# Patient Record
Sex: Female | Born: 1960 | Race: Black or African American | Hispanic: No | Smoking: Never smoker
Health system: Southern US, Community
[De-identification: ages and names within clinical notes are randomized; demographics above are authoritative.]

---

## 2014-06-24 DIAGNOSIS — R51 Headache: Secondary | ICD-10-CM | POA: Diagnosis not present

## 2014-06-24 DIAGNOSIS — R5383 Other fatigue: Secondary | ICD-10-CM | POA: Diagnosis not present

## 2014-06-24 DIAGNOSIS — M7521 Bicipital tendinitis, right shoulder: Secondary | ICD-10-CM | POA: Diagnosis not present

## 2014-06-24 DIAGNOSIS — Z79899 Other long term (current) drug therapy: Secondary | ICD-10-CM | POA: Diagnosis not present

## 2014-06-24 DIAGNOSIS — M7522 Bicipital tendinitis, left shoulder: Secondary | ICD-10-CM | POA: Diagnosis not present

## 2014-06-24 DIAGNOSIS — M542 Cervicalgia: Secondary | ICD-10-CM | POA: Diagnosis not present

## 2014-07-09 DIAGNOSIS — M810 Age-related osteoporosis without current pathological fracture: Secondary | ICD-10-CM | POA: Diagnosis not present

## 2014-07-09 DIAGNOSIS — E785 Hyperlipidemia, unspecified: Secondary | ICD-10-CM | POA: Diagnosis not present

## 2014-07-09 DIAGNOSIS — G4700 Insomnia, unspecified: Secondary | ICD-10-CM | POA: Diagnosis not present

## 2014-07-09 DIAGNOSIS — Z6832 Body mass index (BMI) 32.0-32.9, adult: Secondary | ICD-10-CM | POA: Diagnosis not present

## 2014-07-09 DIAGNOSIS — F411 Generalized anxiety disorder: Secondary | ICD-10-CM | POA: Diagnosis not present

## 2014-07-09 DIAGNOSIS — M7521 Bicipital tendinitis, right shoulder: Secondary | ICD-10-CM | POA: Diagnosis not present

## 2014-07-09 DIAGNOSIS — I10 Essential (primary) hypertension: Secondary | ICD-10-CM | POA: Diagnosis not present

## 2014-07-09 DIAGNOSIS — K743 Primary biliary cirrhosis: Secondary | ICD-10-CM | POA: Diagnosis not present

## 2014-07-09 DIAGNOSIS — M7522 Bicipital tendinitis, left shoulder: Secondary | ICD-10-CM | POA: Diagnosis not present

## 2014-11-23 DIAGNOSIS — Z23 Encounter for immunization: Secondary | ICD-10-CM | POA: Diagnosis not present

## 2014-12-13 DIAGNOSIS — R0781 Pleurodynia: Secondary | ICD-10-CM | POA: Diagnosis not present

## 2014-12-13 DIAGNOSIS — I10 Essential (primary) hypertension: Secondary | ICD-10-CM | POA: Diagnosis not present

## 2014-12-13 DIAGNOSIS — K7460 Unspecified cirrhosis of liver: Secondary | ICD-10-CM | POA: Diagnosis not present

## 2014-12-13 DIAGNOSIS — R079 Chest pain, unspecified: Secondary | ICD-10-CM | POA: Diagnosis not present

## 2014-12-13 DIAGNOSIS — M810 Age-related osteoporosis without current pathological fracture: Secondary | ICD-10-CM | POA: Diagnosis not present

## 2014-12-13 DIAGNOSIS — Z87891 Personal history of nicotine dependence: Secondary | ICD-10-CM | POA: Diagnosis not present

## 2014-12-18 DIAGNOSIS — R0781 Pleurodynia: Secondary | ICD-10-CM | POA: Diagnosis not present

## 2014-12-18 DIAGNOSIS — Z79899 Other long term (current) drug therapy: Secondary | ICD-10-CM | POA: Diagnosis not present

## 2014-12-18 DIAGNOSIS — E785 Hyperlipidemia, unspecified: Secondary | ICD-10-CM | POA: Diagnosis not present

## 2014-12-18 DIAGNOSIS — I10 Essential (primary) hypertension: Secondary | ICD-10-CM | POA: Diagnosis not present

## 2014-12-18 DIAGNOSIS — F419 Anxiety disorder, unspecified: Secondary | ICD-10-CM | POA: Diagnosis not present

## 2014-12-18 DIAGNOSIS — Z87891 Personal history of nicotine dependence: Secondary | ICD-10-CM | POA: Diagnosis not present

## 2014-12-18 DIAGNOSIS — F329 Major depressive disorder, single episode, unspecified: Secondary | ICD-10-CM | POA: Diagnosis not present

## 2014-12-18 DIAGNOSIS — M810 Age-related osteoporosis without current pathological fracture: Secondary | ICD-10-CM | POA: Diagnosis not present

## 2014-12-18 DIAGNOSIS — S20211A Contusion of right front wall of thorax, initial encounter: Secondary | ICD-10-CM | POA: Diagnosis not present

## 2014-12-23 DIAGNOSIS — S20211A Contusion of right front wall of thorax, initial encounter: Secondary | ICD-10-CM | POA: Diagnosis not present

## 2014-12-23 DIAGNOSIS — Z6841 Body Mass Index (BMI) 40.0 and over, adult: Secondary | ICD-10-CM | POA: Diagnosis not present

## 2015-03-04 DIAGNOSIS — Z0189 Encounter for other specified special examinations: Secondary | ICD-10-CM | POA: Diagnosis not present

## 2015-03-04 DIAGNOSIS — R350 Frequency of micturition: Secondary | ICD-10-CM | POA: Diagnosis not present

## 2015-03-04 DIAGNOSIS — M810 Age-related osteoporosis without current pathological fracture: Secondary | ICD-10-CM | POA: Diagnosis not present

## 2015-03-04 DIAGNOSIS — Z0000 Encounter for general adult medical examination without abnormal findings: Secondary | ICD-10-CM | POA: Diagnosis not present

## 2015-03-04 DIAGNOSIS — K743 Primary biliary cirrhosis: Secondary | ICD-10-CM | POA: Diagnosis not present

## 2015-03-04 DIAGNOSIS — Z6841 Body Mass Index (BMI) 40.0 and over, adult: Secondary | ICD-10-CM | POA: Diagnosis not present

## 2015-04-14 DIAGNOSIS — K743 Primary biliary cirrhosis: Secondary | ICD-10-CM | POA: Diagnosis not present

## 2015-04-16 DIAGNOSIS — K743 Primary biliary cirrhosis: Secondary | ICD-10-CM | POA: Diagnosis not present

## 2015-09-28 DIAGNOSIS — M8589 Other specified disorders of bone density and structure, multiple sites: Secondary | ICD-10-CM | POA: Diagnosis not present

## 2015-09-28 DIAGNOSIS — Z780 Asymptomatic menopausal state: Secondary | ICD-10-CM | POA: Diagnosis not present

## 2015-09-28 DIAGNOSIS — Z1231 Encounter for screening mammogram for malignant neoplasm of breast: Secondary | ICD-10-CM | POA: Diagnosis not present

## 2015-10-15 DIAGNOSIS — N6002 Solitary cyst of left breast: Secondary | ICD-10-CM | POA: Diagnosis not present

## 2015-10-15 DIAGNOSIS — N63 Unspecified lump in breast: Secondary | ICD-10-CM | POA: Diagnosis not present

## 2015-10-15 DIAGNOSIS — R928 Other abnormal and inconclusive findings on diagnostic imaging of breast: Secondary | ICD-10-CM | POA: Diagnosis not present

## 2015-10-23 ENCOUNTER — Other Ambulatory Visit: Payer: Self-pay | Admitting: Gastroenterology

## 2015-10-23 DIAGNOSIS — M810 Age-related osteoporosis without current pathological fracture: Secondary | ICD-10-CM | POA: Diagnosis not present

## 2015-10-23 DIAGNOSIS — E669 Obesity, unspecified: Secondary | ICD-10-CM | POA: Diagnosis not present

## 2015-10-23 DIAGNOSIS — K745 Biliary cirrhosis, unspecified: Secondary | ICD-10-CM

## 2015-10-23 DIAGNOSIS — D696 Thrombocytopenia, unspecified: Secondary | ICD-10-CM | POA: Diagnosis not present

## 2015-10-23 DIAGNOSIS — K743 Primary biliary cirrhosis: Secondary | ICD-10-CM | POA: Diagnosis not present

## 2015-10-23 DIAGNOSIS — D638 Anemia in other chronic diseases classified elsewhere: Secondary | ICD-10-CM | POA: Diagnosis not present

## 2015-10-28 DIAGNOSIS — N63 Unspecified lump in breast: Secondary | ICD-10-CM | POA: Diagnosis not present

## 2015-10-28 DIAGNOSIS — N6011 Diffuse cystic mastopathy of right breast: Secondary | ICD-10-CM | POA: Diagnosis not present

## 2015-10-30 ENCOUNTER — Ambulatory Visit
Admission: RE | Admit: 2015-10-30 | Discharge: 2015-10-30 | Disposition: A | Discharge status: Home / Self Care | Payer: Medicare Other | Source: Ambulatory Visit | Attending: Gastroenterology | Admitting: Gastroenterology

## 2015-10-30 DIAGNOSIS — K7460 Unspecified cirrhosis of liver: Secondary | ICD-10-CM | POA: Diagnosis not present

## 2015-10-30 DIAGNOSIS — K745 Biliary cirrhosis, unspecified: Secondary | ICD-10-CM

## 2015-10-30 MED ORDER — IOPAMIDOL (ISOVUE-300) INJECTION 61%
100.0000 mL | Freq: Once | INTRAVENOUS | Status: AC | PRN
  Administered 2015-10-30: 100 mL via INTRAVENOUS

## 2015-11-02 DIAGNOSIS — H1131 Conjunctival hemorrhage, right eye: Secondary | ICD-10-CM | POA: Diagnosis not present

## 2015-11-10 DIAGNOSIS — K743 Primary biliary cirrhosis: Secondary | ICD-10-CM | POA: Diagnosis not present

## 2015-11-10 DIAGNOSIS — Z23 Encounter for immunization: Secondary | ICD-10-CM | POA: Diagnosis not present

## 2015-11-18 DIAGNOSIS — K529 Noninfective gastroenteritis and colitis, unspecified: Secondary | ICD-10-CM | POA: Diagnosis not present

## 2015-11-18 DIAGNOSIS — K629 Disease of anus and rectum, unspecified: Secondary | ICD-10-CM | POA: Diagnosis not present

## 2015-11-18 DIAGNOSIS — Z13810 Encounter for screening for upper gastrointestinal disorder: Secondary | ICD-10-CM | POA: Diagnosis not present

## 2015-11-18 DIAGNOSIS — K259 Gastric ulcer, unspecified as acute or chronic, without hemorrhage or perforation: Secondary | ICD-10-CM | POA: Diagnosis not present

## 2015-11-18 DIAGNOSIS — K269 Duodenal ulcer, unspecified as acute or chronic, without hemorrhage or perforation: Secondary | ICD-10-CM | POA: Diagnosis not present

## 2015-11-18 DIAGNOSIS — K449 Diaphragmatic hernia without obstruction or gangrene: Secondary | ICD-10-CM | POA: Diagnosis not present

## 2015-12-11 DIAGNOSIS — K743 Primary biliary cirrhosis: Secondary | ICD-10-CM | POA: Diagnosis not present

## 2015-12-28 DIAGNOSIS — K743 Primary biliary cirrhosis: Secondary | ICD-10-CM | POA: Diagnosis not present

## 2015-12-28 DIAGNOSIS — I10 Essential (primary) hypertension: Secondary | ICD-10-CM | POA: Diagnosis not present

## 2015-12-28 DIAGNOSIS — E669 Obesity, unspecified: Secondary | ICD-10-CM | POA: Diagnosis not present

## 2015-12-28 DIAGNOSIS — L98499 Non-pressure chronic ulcer of skin of other sites with unspecified severity: Secondary | ICD-10-CM | POA: Diagnosis not present

## 2015-12-28 DIAGNOSIS — Z23 Encounter for immunization: Secondary | ICD-10-CM | POA: Diagnosis not present

## 2015-12-28 DIAGNOSIS — Z6841 Body Mass Index (BMI) 40.0 and over, adult: Secondary | ICD-10-CM | POA: Diagnosis not present

## 2015-12-28 DIAGNOSIS — E7800 Pure hypercholesterolemia, unspecified: Secondary | ICD-10-CM | POA: Diagnosis not present

## 2015-12-28 DIAGNOSIS — M8580 Other specified disorders of bone density and structure, unspecified site: Secondary | ICD-10-CM | POA: Diagnosis not present

## 2016-01-11 DIAGNOSIS — D696 Thrombocytopenia, unspecified: Secondary | ICD-10-CM | POA: Diagnosis not present

## 2016-01-11 DIAGNOSIS — K259 Gastric ulcer, unspecified as acute or chronic, without hemorrhage or perforation: Secondary | ICD-10-CM | POA: Diagnosis not present

## 2016-01-11 DIAGNOSIS — M8580 Other specified disorders of bone density and structure, unspecified site: Secondary | ICD-10-CM | POA: Diagnosis not present

## 2016-01-11 DIAGNOSIS — K743 Primary biliary cirrhosis: Secondary | ICD-10-CM | POA: Diagnosis not present

## 2016-01-28 DIAGNOSIS — K289 Gastrojejunal ulcer, unspecified as acute or chronic, without hemorrhage or perforation: Secondary | ICD-10-CM | POA: Diagnosis not present

## 2016-01-28 DIAGNOSIS — K2930 Chronic superficial gastritis without bleeding: Secondary | ICD-10-CM | POA: Diagnosis not present

## 2016-01-28 DIAGNOSIS — K2970 Gastritis, unspecified, without bleeding: Secondary | ICD-10-CM | POA: Diagnosis not present

## 2016-02-03 DIAGNOSIS — K2930 Chronic superficial gastritis without bleeding: Secondary | ICD-10-CM | POA: Diagnosis not present

## 2016-02-11 DIAGNOSIS — R350 Frequency of micturition: Secondary | ICD-10-CM | POA: Diagnosis not present

## 2016-03-02 DIAGNOSIS — Z6841 Body Mass Index (BMI) 40.0 and over, adult: Secondary | ICD-10-CM | POA: Diagnosis not present

## 2016-03-02 DIAGNOSIS — E7800 Pure hypercholesterolemia, unspecified: Secondary | ICD-10-CM | POA: Diagnosis not present

## 2016-03-02 DIAGNOSIS — E669 Obesity, unspecified: Secondary | ICD-10-CM | POA: Diagnosis not present

## 2016-03-02 DIAGNOSIS — Z79899 Other long term (current) drug therapy: Secondary | ICD-10-CM | POA: Diagnosis not present

## 2016-05-03 DIAGNOSIS — D241 Benign neoplasm of right breast: Secondary | ICD-10-CM | POA: Diagnosis not present

## 2016-05-12 DIAGNOSIS — K743 Primary biliary cirrhosis: Secondary | ICD-10-CM | POA: Diagnosis not present

## 2016-06-06 ENCOUNTER — Other Ambulatory Visit: Payer: Self-pay | Admitting: Gastroenterology

## 2016-06-06 DIAGNOSIS — K219 Gastro-esophageal reflux disease without esophagitis: Secondary | ICD-10-CM | POA: Diagnosis not present

## 2016-06-06 DIAGNOSIS — L299 Pruritus, unspecified: Secondary | ICD-10-CM | POA: Diagnosis not present

## 2016-06-06 DIAGNOSIS — R634 Abnormal weight loss: Secondary | ICD-10-CM | POA: Diagnosis not present

## 2016-06-06 DIAGNOSIS — K625 Hemorrhage of anus and rectum: Secondary | ICD-10-CM | POA: Diagnosis not present

## 2016-06-06 DIAGNOSIS — K743 Primary biliary cirrhosis: Secondary | ICD-10-CM | POA: Diagnosis not present

## 2016-06-06 DIAGNOSIS — R1011 Right upper quadrant pain: Secondary | ICD-10-CM

## 2016-06-09 DIAGNOSIS — K5730 Diverticulosis of large intestine without perforation or abscess without bleeding: Secondary | ICD-10-CM | POA: Diagnosis not present

## 2016-06-09 DIAGNOSIS — K648 Other hemorrhoids: Secondary | ICD-10-CM | POA: Diagnosis not present

## 2016-06-09 DIAGNOSIS — K625 Hemorrhage of anus and rectum: Secondary | ICD-10-CM | POA: Diagnosis not present

## 2016-06-09 DIAGNOSIS — D126 Benign neoplasm of colon, unspecified: Secondary | ICD-10-CM | POA: Diagnosis not present

## 2016-06-09 DIAGNOSIS — K5520 Angiodysplasia of colon without hemorrhage: Secondary | ICD-10-CM | POA: Diagnosis not present

## 2016-06-09 DIAGNOSIS — K644 Residual hemorrhoidal skin tags: Secondary | ICD-10-CM | POA: Diagnosis not present

## 2016-06-10 ENCOUNTER — Ambulatory Visit
Admission: RE | Admit: 2016-06-10 | Discharge: 2016-06-10 | Disposition: A | Discharge status: Home / Self Care | Payer: Medicare Other | Source: Ambulatory Visit | Attending: Gastroenterology | Admitting: Gastroenterology

## 2016-06-10 DIAGNOSIS — R1011 Right upper quadrant pain: Secondary | ICD-10-CM

## 2016-06-14 DIAGNOSIS — D126 Benign neoplasm of colon, unspecified: Secondary | ICD-10-CM | POA: Diagnosis not present

## 2016-07-19 DIAGNOSIS — K743 Primary biliary cirrhosis: Secondary | ICD-10-CM | POA: Diagnosis not present

## 2016-07-21 DIAGNOSIS — F419 Anxiety disorder, unspecified: Secondary | ICD-10-CM | POA: Diagnosis not present

## 2016-07-21 DIAGNOSIS — F5104 Psychophysiologic insomnia: Secondary | ICD-10-CM | POA: Diagnosis not present

## 2016-10-12 DIAGNOSIS — K219 Gastro-esophageal reflux disease without esophagitis: Secondary | ICD-10-CM | POA: Diagnosis not present

## 2016-10-12 DIAGNOSIS — L03116 Cellulitis of left lower limb: Secondary | ICD-10-CM | POA: Diagnosis not present

## 2016-10-12 DIAGNOSIS — K743 Primary biliary cirrhosis: Secondary | ICD-10-CM | POA: Diagnosis not present

## 2016-10-12 DIAGNOSIS — S80861A Insect bite (nonvenomous), right lower leg, initial encounter: Secondary | ICD-10-CM | POA: Diagnosis not present

## 2016-10-25 DIAGNOSIS — F4310 Post-traumatic stress disorder, unspecified: Secondary | ICD-10-CM | POA: Diagnosis not present

## 2016-10-26 DIAGNOSIS — Z853 Personal history of malignant neoplasm of breast: Secondary | ICD-10-CM | POA: Diagnosis not present

## 2016-10-26 DIAGNOSIS — Z1231 Encounter for screening mammogram for malignant neoplasm of breast: Secondary | ICD-10-CM | POA: Diagnosis not present

## 2016-10-31 DIAGNOSIS — M8580 Other specified disorders of bone density and structure, unspecified site: Secondary | ICD-10-CM | POA: Diagnosis not present

## 2016-10-31 DIAGNOSIS — M859 Disorder of bone density and structure, unspecified: Secondary | ICD-10-CM | POA: Diagnosis not present

## 2016-10-31 DIAGNOSIS — Z6841 Body Mass Index (BMI) 40.0 and over, adult: Secondary | ICD-10-CM | POA: Diagnosis not present

## 2016-10-31 DIAGNOSIS — I10 Essential (primary) hypertension: Secondary | ICD-10-CM | POA: Diagnosis not present

## 2016-10-31 DIAGNOSIS — E7800 Pure hypercholesterolemia, unspecified: Secondary | ICD-10-CM | POA: Diagnosis not present

## 2016-10-31 DIAGNOSIS — F5104 Psychophysiologic insomnia: Secondary | ICD-10-CM | POA: Diagnosis not present

## 2016-10-31 DIAGNOSIS — F419 Anxiety disorder, unspecified: Secondary | ICD-10-CM | POA: Diagnosis not present

## 2016-12-20 ENCOUNTER — Other Ambulatory Visit: Payer: Self-pay | Admitting: Gastroenterology

## 2016-12-20 DIAGNOSIS — K743 Primary biliary cirrhosis: Secondary | ICD-10-CM

## 2016-12-29 ENCOUNTER — Ambulatory Visit
Admission: RE | Admit: 2016-12-29 | Discharge: 2016-12-29 | Disposition: A | Discharge status: Home / Self Care | Payer: Medicare Other | Source: Ambulatory Visit | Attending: Gastroenterology | Admitting: Gastroenterology

## 2016-12-29 DIAGNOSIS — K743 Primary biliary cirrhosis: Secondary | ICD-10-CM

## 2016-12-29 DIAGNOSIS — K7460 Unspecified cirrhosis of liver: Secondary | ICD-10-CM | POA: Diagnosis not present

## 2017-02-10 DIAGNOSIS — Z23 Encounter for immunization: Secondary | ICD-10-CM | POA: Diagnosis not present

## 2017-02-22 DIAGNOSIS — K210 Gastro-esophageal reflux disease with esophagitis: Secondary | ICD-10-CM | POA: Diagnosis not present

## 2017-02-22 DIAGNOSIS — K219 Gastro-esophageal reflux disease without esophagitis: Secondary | ICD-10-CM | POA: Diagnosis not present

## 2017-02-22 DIAGNOSIS — K2970 Gastritis, unspecified, without bleeding: Secondary | ICD-10-CM | POA: Diagnosis not present

## 2017-02-22 DIAGNOSIS — K257 Chronic gastric ulcer without hemorrhage or perforation: Secondary | ICD-10-CM | POA: Diagnosis not present

## 2017-02-22 DIAGNOSIS — K7460 Unspecified cirrhosis of liver: Secondary | ICD-10-CM | POA: Diagnosis not present

## 2017-04-18 DIAGNOSIS — K743 Primary biliary cirrhosis: Secondary | ICD-10-CM | POA: Diagnosis not present

## 2017-04-18 DIAGNOSIS — K219 Gastro-esophageal reflux disease without esophagitis: Secondary | ICD-10-CM | POA: Diagnosis not present

## 2017-04-18 DIAGNOSIS — K625 Hemorrhage of anus and rectum: Secondary | ICD-10-CM | POA: Diagnosis not present

## 2017-05-05 DIAGNOSIS — E7800 Pure hypercholesterolemia, unspecified: Secondary | ICD-10-CM | POA: Diagnosis not present

## 2017-05-05 DIAGNOSIS — M8580 Other specified disorders of bone density and structure, unspecified site: Secondary | ICD-10-CM | POA: Diagnosis not present

## 2017-05-05 DIAGNOSIS — I10 Essential (primary) hypertension: Secondary | ICD-10-CM | POA: Diagnosis not present

## 2017-05-05 DIAGNOSIS — Z23 Encounter for immunization: Secondary | ICD-10-CM | POA: Diagnosis not present

## 2017-05-05 DIAGNOSIS — F5104 Psychophysiologic insomnia: Secondary | ICD-10-CM | POA: Diagnosis not present

## 2017-05-05 DIAGNOSIS — F332 Major depressive disorder, recurrent severe without psychotic features: Secondary | ICD-10-CM | POA: Diagnosis not present

## 2017-06-13 DIAGNOSIS — F332 Major depressive disorder, recurrent severe without psychotic features: Secondary | ICD-10-CM | POA: Diagnosis not present

## 2017-08-22 DIAGNOSIS — F332 Major depressive disorder, recurrent severe without psychotic features: Secondary | ICD-10-CM | POA: Diagnosis not present

## 2017-08-22 DIAGNOSIS — Z6841 Body Mass Index (BMI) 40.0 and over, adult: Secondary | ICD-10-CM | POA: Diagnosis not present

## 2017-09-10 DIAGNOSIS — J069 Acute upper respiratory infection, unspecified: Secondary | ICD-10-CM | POA: Diagnosis not present

## 2017-09-10 DIAGNOSIS — I10 Essential (primary) hypertension: Secondary | ICD-10-CM | POA: Diagnosis not present

## 2017-09-10 DIAGNOSIS — H10023 Other mucopurulent conjunctivitis, bilateral: Secondary | ICD-10-CM | POA: Diagnosis not present

## 2017-10-28 DIAGNOSIS — Z0131 Encounter for examination of blood pressure with abnormal findings: Secondary | ICD-10-CM | POA: Diagnosis not present

## 2017-10-28 DIAGNOSIS — H10023 Other mucopurulent conjunctivitis, bilateral: Secondary | ICD-10-CM | POA: Diagnosis not present

## 2017-10-28 DIAGNOSIS — E782 Mixed hyperlipidemia: Secondary | ICD-10-CM | POA: Diagnosis not present

## 2017-12-22 DIAGNOSIS — F419 Anxiety disorder, unspecified: Secondary | ICD-10-CM | POA: Diagnosis not present

## 2017-12-22 DIAGNOSIS — Z6841 Body Mass Index (BMI) 40.0 and over, adult: Secondary | ICD-10-CM | POA: Diagnosis not present

## 2017-12-22 DIAGNOSIS — M8580 Other specified disorders of bone density and structure, unspecified site: Secondary | ICD-10-CM | POA: Diagnosis not present

## 2017-12-22 DIAGNOSIS — Z23 Encounter for immunization: Secondary | ICD-10-CM | POA: Diagnosis not present

## 2017-12-22 DIAGNOSIS — K743 Primary biliary cirrhosis: Secondary | ICD-10-CM | POA: Diagnosis not present

## 2017-12-22 DIAGNOSIS — K219 Gastro-esophageal reflux disease without esophagitis: Secondary | ICD-10-CM | POA: Diagnosis not present

## 2017-12-22 DIAGNOSIS — E7800 Pure hypercholesterolemia, unspecified: Secondary | ICD-10-CM | POA: Diagnosis not present

## 2017-12-22 DIAGNOSIS — F5104 Psychophysiologic insomnia: Secondary | ICD-10-CM | POA: Diagnosis not present

## 2017-12-22 DIAGNOSIS — I10 Essential (primary) hypertension: Secondary | ICD-10-CM | POA: Diagnosis not present

## 2018-01-01 DIAGNOSIS — S8012XA Contusion of left lower leg, initial encounter: Secondary | ICD-10-CM | POA: Diagnosis not present

## 2018-01-02 DIAGNOSIS — Z803 Family history of malignant neoplasm of breast: Secondary | ICD-10-CM | POA: Diagnosis not present

## 2018-01-02 DIAGNOSIS — M8589 Other specified disorders of bone density and structure, multiple sites: Secondary | ICD-10-CM | POA: Diagnosis not present

## 2018-01-02 DIAGNOSIS — Z1231 Encounter for screening mammogram for malignant neoplasm of breast: Secondary | ICD-10-CM | POA: Diagnosis not present

## 2018-01-08 DIAGNOSIS — M25572 Pain in left ankle and joints of left foot: Secondary | ICD-10-CM | POA: Diagnosis not present

## 2018-01-08 DIAGNOSIS — M62561 Muscle wasting and atrophy, not elsewhere classified, right lower leg: Secondary | ICD-10-CM | POA: Diagnosis not present

## 2018-01-08 DIAGNOSIS — M25571 Pain in right ankle and joints of right foot: Secondary | ICD-10-CM | POA: Diagnosis not present

## 2018-01-08 DIAGNOSIS — M62562 Muscle wasting and atrophy, not elsewhere classified, left lower leg: Secondary | ICD-10-CM | POA: Diagnosis not present

## 2018-01-09 DIAGNOSIS — M25571 Pain in right ankle and joints of right foot: Secondary | ICD-10-CM | POA: Diagnosis not present

## 2018-01-09 DIAGNOSIS — M62562 Muscle wasting and atrophy, not elsewhere classified, left lower leg: Secondary | ICD-10-CM | POA: Diagnosis not present

## 2018-01-09 DIAGNOSIS — M25572 Pain in left ankle and joints of left foot: Secondary | ICD-10-CM | POA: Diagnosis not present

## 2018-01-09 DIAGNOSIS — M62561 Muscle wasting and atrophy, not elsewhere classified, right lower leg: Secondary | ICD-10-CM | POA: Diagnosis not present

## 2018-01-11 DIAGNOSIS — M62561 Muscle wasting and atrophy, not elsewhere classified, right lower leg: Secondary | ICD-10-CM | POA: Diagnosis not present

## 2018-01-11 DIAGNOSIS — M62562 Muscle wasting and atrophy, not elsewhere classified, left lower leg: Secondary | ICD-10-CM | POA: Diagnosis not present

## 2018-01-11 DIAGNOSIS — M25572 Pain in left ankle and joints of left foot: Secondary | ICD-10-CM | POA: Diagnosis not present

## 2018-01-11 DIAGNOSIS — M25571 Pain in right ankle and joints of right foot: Secondary | ICD-10-CM | POA: Diagnosis not present

## 2018-01-15 DIAGNOSIS — M62561 Muscle wasting and atrophy, not elsewhere classified, right lower leg: Secondary | ICD-10-CM | POA: Diagnosis not present

## 2018-01-15 DIAGNOSIS — M62562 Muscle wasting and atrophy, not elsewhere classified, left lower leg: Secondary | ICD-10-CM | POA: Diagnosis not present

## 2018-01-15 DIAGNOSIS — M25571 Pain in right ankle and joints of right foot: Secondary | ICD-10-CM | POA: Diagnosis not present

## 2018-01-15 DIAGNOSIS — M25572 Pain in left ankle and joints of left foot: Secondary | ICD-10-CM | POA: Diagnosis not present

## 2018-01-26 DIAGNOSIS — M79669 Pain in unspecified lower leg: Secondary | ICD-10-CM | POA: Diagnosis not present

## 2018-03-20 DIAGNOSIS — K625 Hemorrhage of anus and rectum: Secondary | ICD-10-CM | POA: Diagnosis not present

## 2018-03-20 DIAGNOSIS — Z6841 Body Mass Index (BMI) 40.0 and over, adult: Secondary | ICD-10-CM | POA: Diagnosis not present

## 2018-03-20 DIAGNOSIS — K219 Gastro-esophageal reflux disease without esophagitis: Secondary | ICD-10-CM | POA: Diagnosis not present

## 2018-03-20 DIAGNOSIS — K743 Primary biliary cirrhosis: Secondary | ICD-10-CM | POA: Diagnosis not present

## 2018-03-20 DIAGNOSIS — E669 Obesity, unspecified: Secondary | ICD-10-CM | POA: Diagnosis not present

## 2018-03-22 ENCOUNTER — Other Ambulatory Visit: Payer: Self-pay | Admitting: Gastroenterology

## 2018-03-22 DIAGNOSIS — K743 Primary biliary cirrhosis: Secondary | ICD-10-CM

## 2018-03-26 ENCOUNTER — Ambulatory Visit
Admission: RE | Admit: 2018-03-26 | Discharge: 2018-03-26 | Disposition: A | Discharge status: Home / Self Care | Payer: Medicare Other | Source: Ambulatory Visit | Attending: Gastroenterology | Admitting: Gastroenterology

## 2018-03-26 DIAGNOSIS — K743 Primary biliary cirrhosis: Secondary | ICD-10-CM

## 2018-03-26 DIAGNOSIS — K7460 Unspecified cirrhosis of liver: Secondary | ICD-10-CM | POA: Diagnosis not present

## 2018-04-25 DIAGNOSIS — N644 Mastodynia: Secondary | ICD-10-CM | POA: Diagnosis not present

## 2018-04-27 DIAGNOSIS — N61 Mastitis without abscess: Secondary | ICD-10-CM | POA: Diagnosis not present

## 2018-06-27 DIAGNOSIS — F5104 Psychophysiologic insomnia: Secondary | ICD-10-CM | POA: Diagnosis not present

## 2018-06-27 DIAGNOSIS — E7800 Pure hypercholesterolemia, unspecified: Secondary | ICD-10-CM | POA: Diagnosis not present

## 2018-06-27 DIAGNOSIS — I10 Essential (primary) hypertension: Secondary | ICD-10-CM | POA: Diagnosis not present

## 2018-06-27 DIAGNOSIS — R69 Illness, unspecified: Secondary | ICD-10-CM | POA: Diagnosis not present

## 2018-06-27 DIAGNOSIS — L309 Dermatitis, unspecified: Secondary | ICD-10-CM | POA: Diagnosis not present

## 2018-06-27 DIAGNOSIS — E876 Hypokalemia: Secondary | ICD-10-CM | POA: Diagnosis not present

## 2018-06-29 DIAGNOSIS — I10 Essential (primary) hypertension: Secondary | ICD-10-CM | POA: Diagnosis not present

## 2018-08-03 DIAGNOSIS — L989 Disorder of the skin and subcutaneous tissue, unspecified: Secondary | ICD-10-CM | POA: Diagnosis not present

## 2018-08-09 DIAGNOSIS — M79672 Pain in left foot: Secondary | ICD-10-CM | POA: Diagnosis not present

## 2018-08-30 DIAGNOSIS — E7800 Pure hypercholesterolemia, unspecified: Secondary | ICD-10-CM | POA: Diagnosis not present

## 2018-08-30 DIAGNOSIS — D638 Anemia in other chronic diseases classified elsewhere: Secondary | ICD-10-CM | POA: Diagnosis not present

## 2018-08-30 DIAGNOSIS — R69 Illness, unspecified: Secondary | ICD-10-CM | POA: Diagnosis not present

## 2018-08-30 DIAGNOSIS — M19071 Primary osteoarthritis, right ankle and foot: Secondary | ICD-10-CM | POA: Diagnosis not present

## 2018-08-30 DIAGNOSIS — I10 Essential (primary) hypertension: Secondary | ICD-10-CM | POA: Diagnosis not present

## 2018-08-30 DIAGNOSIS — M1712 Unilateral primary osteoarthritis, left knee: Secondary | ICD-10-CM | POA: Diagnosis not present

## 2018-09-06 ENCOUNTER — Other Ambulatory Visit: Payer: Self-pay | Admitting: Gastroenterology

## 2018-09-06 DIAGNOSIS — K7469 Other cirrhosis of liver: Secondary | ICD-10-CM

## 2018-09-18 ENCOUNTER — Ambulatory Visit
Admission: RE | Admit: 2018-09-18 | Discharge: 2018-09-18 | Disposition: A | Discharge status: Home / Self Care | Payer: Medicare HMO | Source: Ambulatory Visit | Attending: Gastroenterology | Admitting: Gastroenterology

## 2018-09-18 DIAGNOSIS — K7469 Other cirrhosis of liver: Secondary | ICD-10-CM

## 2018-09-18 DIAGNOSIS — K7460 Unspecified cirrhosis of liver: Secondary | ICD-10-CM | POA: Diagnosis not present

## 2018-09-18 DIAGNOSIS — K743 Primary biliary cirrhosis: Secondary | ICD-10-CM | POA: Diagnosis not present

## 2018-09-18 DIAGNOSIS — K625 Hemorrhage of anus and rectum: Secondary | ICD-10-CM | POA: Diagnosis not present

## 2018-09-18 DIAGNOSIS — K219 Gastro-esophageal reflux disease without esophagitis: Secondary | ICD-10-CM | POA: Diagnosis not present

## 2018-09-20 DIAGNOSIS — K743 Primary biliary cirrhosis: Secondary | ICD-10-CM | POA: Diagnosis not present

## 2018-11-09 DIAGNOSIS — K625 Hemorrhage of anus and rectum: Secondary | ICD-10-CM | POA: Diagnosis not present

## 2018-11-09 DIAGNOSIS — K635 Polyp of colon: Secondary | ICD-10-CM | POA: Diagnosis not present

## 2018-11-09 DIAGNOSIS — K5520 Angiodysplasia of colon without hemorrhage: Secondary | ICD-10-CM | POA: Diagnosis not present

## 2018-11-09 DIAGNOSIS — K649 Unspecified hemorrhoids: Secondary | ICD-10-CM | POA: Diagnosis not present

## 2018-11-13 DIAGNOSIS — K635 Polyp of colon: Secondary | ICD-10-CM | POA: Diagnosis not present

## 2018-12-03 DIAGNOSIS — K625 Hemorrhage of anus and rectum: Secondary | ICD-10-CM | POA: Diagnosis not present

## 2018-12-03 DIAGNOSIS — K743 Primary biliary cirrhosis: Secondary | ICD-10-CM | POA: Diagnosis not present

## 2018-12-03 DIAGNOSIS — K219 Gastro-esophageal reflux disease without esophagitis: Secondary | ICD-10-CM | POA: Diagnosis not present

## 2018-12-03 DIAGNOSIS — I10 Essential (primary) hypertension: Secondary | ICD-10-CM | POA: Diagnosis not present

## 2018-12-03 DIAGNOSIS — E7800 Pure hypercholesterolemia, unspecified: Secondary | ICD-10-CM | POA: Diagnosis not present

## 2018-12-05 DIAGNOSIS — R69 Illness, unspecified: Secondary | ICD-10-CM | POA: Diagnosis not present

## 2018-12-05 DIAGNOSIS — R739 Hyperglycemia, unspecified: Secondary | ICD-10-CM | POA: Diagnosis not present

## 2018-12-05 DIAGNOSIS — E7800 Pure hypercholesterolemia, unspecified: Secondary | ICD-10-CM | POA: Diagnosis not present

## 2018-12-05 DIAGNOSIS — I10 Essential (primary) hypertension: Secondary | ICD-10-CM | POA: Diagnosis not present

## 2018-12-07 DIAGNOSIS — R69 Illness, unspecified: Secondary | ICD-10-CM | POA: Diagnosis not present

## 2018-12-07 DIAGNOSIS — I10 Essential (primary) hypertension: Secondary | ICD-10-CM | POA: Diagnosis not present

## 2018-12-07 DIAGNOSIS — R739 Hyperglycemia, unspecified: Secondary | ICD-10-CM | POA: Diagnosis not present

## 2018-12-07 DIAGNOSIS — E7800 Pure hypercholesterolemia, unspecified: Secondary | ICD-10-CM | POA: Diagnosis not present

## 2018-12-28 DIAGNOSIS — R69 Illness, unspecified: Secondary | ICD-10-CM | POA: Diagnosis not present

## 2019-01-01 DIAGNOSIS — M1712 Unilateral primary osteoarthritis, left knee: Secondary | ICD-10-CM | POA: Diagnosis not present

## 2019-01-01 DIAGNOSIS — M19071 Primary osteoarthritis, right ankle and foot: Secondary | ICD-10-CM | POA: Diagnosis not present

## 2019-01-01 DIAGNOSIS — I10 Essential (primary) hypertension: Secondary | ICD-10-CM | POA: Diagnosis not present

## 2019-01-01 DIAGNOSIS — M810 Age-related osteoporosis without current pathological fracture: Secondary | ICD-10-CM | POA: Diagnosis not present

## 2019-01-01 DIAGNOSIS — R69 Illness, unspecified: Secondary | ICD-10-CM | POA: Diagnosis not present

## 2019-01-01 DIAGNOSIS — E7800 Pure hypercholesterolemia, unspecified: Secondary | ICD-10-CM | POA: Diagnosis not present

## 2019-01-01 DIAGNOSIS — Z862 Personal history of diseases of the blood and blood-forming organs and certain disorders involving the immune mechanism: Secondary | ICD-10-CM | POA: Diagnosis not present

## 2019-01-25 DIAGNOSIS — Z803 Family history of malignant neoplasm of breast: Secondary | ICD-10-CM | POA: Diagnosis not present

## 2019-01-25 DIAGNOSIS — Z1231 Encounter for screening mammogram for malignant neoplasm of breast: Secondary | ICD-10-CM | POA: Diagnosis not present

## 2019-02-11 DIAGNOSIS — E7800 Pure hypercholesterolemia, unspecified: Secondary | ICD-10-CM | POA: Diagnosis not present

## 2019-02-11 DIAGNOSIS — Z79899 Other long term (current) drug therapy: Secondary | ICD-10-CM | POA: Diagnosis not present

## 2019-03-27 ENCOUNTER — Other Ambulatory Visit: Payer: Self-pay | Admitting: Gastroenterology

## 2019-03-27 DIAGNOSIS — K743 Primary biliary cirrhosis: Secondary | ICD-10-CM

## 2019-04-09 DIAGNOSIS — E7800 Pure hypercholesterolemia, unspecified: Secondary | ICD-10-CM | POA: Diagnosis not present

## 2019-04-09 DIAGNOSIS — M19071 Primary osteoarthritis, right ankle and foot: Secondary | ICD-10-CM | POA: Diagnosis not present

## 2019-04-09 DIAGNOSIS — I10 Essential (primary) hypertension: Secondary | ICD-10-CM | POA: Diagnosis not present

## 2019-04-09 DIAGNOSIS — D649 Anemia, unspecified: Secondary | ICD-10-CM | POA: Diagnosis not present

## 2019-04-09 DIAGNOSIS — M1712 Unilateral primary osteoarthritis, left knee: Secondary | ICD-10-CM | POA: Diagnosis not present

## 2019-04-09 DIAGNOSIS — M810 Age-related osteoporosis without current pathological fracture: Secondary | ICD-10-CM | POA: Diagnosis not present

## 2019-04-09 DIAGNOSIS — R69 Illness, unspecified: Secondary | ICD-10-CM | POA: Diagnosis not present

## 2019-04-10 ENCOUNTER — Ambulatory Visit
Admission: RE | Admit: 2019-04-10 | Discharge: 2019-04-10 | Disposition: A | Discharge status: Home / Self Care | Payer: Medicare HMO | Source: Ambulatory Visit | Attending: Gastroenterology | Admitting: Gastroenterology

## 2019-04-10 DIAGNOSIS — K743 Primary biliary cirrhosis: Secondary | ICD-10-CM

## 2019-04-10 DIAGNOSIS — K7460 Unspecified cirrhosis of liver: Secondary | ICD-10-CM | POA: Diagnosis not present

## 2019-05-07 DIAGNOSIS — K743 Primary biliary cirrhosis: Secondary | ICD-10-CM | POA: Diagnosis not present

## 2019-05-07 DIAGNOSIS — K649 Unspecified hemorrhoids: Secondary | ICD-10-CM | POA: Diagnosis not present

## 2019-07-01 DIAGNOSIS — M810 Age-related osteoporosis without current pathological fracture: Secondary | ICD-10-CM | POA: Diagnosis not present

## 2019-07-01 DIAGNOSIS — M19071 Primary osteoarthritis, right ankle and foot: Secondary | ICD-10-CM | POA: Diagnosis not present

## 2019-07-01 DIAGNOSIS — M1712 Unilateral primary osteoarthritis, left knee: Secondary | ICD-10-CM | POA: Diagnosis not present

## 2019-07-01 DIAGNOSIS — I10 Essential (primary) hypertension: Secondary | ICD-10-CM | POA: Diagnosis not present

## 2019-07-01 DIAGNOSIS — D638 Anemia in other chronic diseases classified elsewhere: Secondary | ICD-10-CM | POA: Diagnosis not present

## 2019-07-01 DIAGNOSIS — R69 Illness, unspecified: Secondary | ICD-10-CM | POA: Diagnosis not present

## 2019-07-01 DIAGNOSIS — E7800 Pure hypercholesterolemia, unspecified: Secondary | ICD-10-CM | POA: Diagnosis not present

## 2019-07-05 DIAGNOSIS — H9312 Tinnitus, left ear: Secondary | ICD-10-CM | POA: Diagnosis not present

## 2019-07-08 DIAGNOSIS — H6503 Acute serous otitis media, bilateral: Secondary | ICD-10-CM | POA: Diagnosis not present

## 2019-07-08 DIAGNOSIS — I10 Essential (primary) hypertension: Secondary | ICD-10-CM | POA: Diagnosis not present

## 2019-07-18 DIAGNOSIS — M810 Age-related osteoporosis without current pathological fracture: Secondary | ICD-10-CM | POA: Diagnosis not present

## 2019-07-18 DIAGNOSIS — K219 Gastro-esophageal reflux disease without esophagitis: Secondary | ICD-10-CM | POA: Diagnosis not present

## 2019-07-18 DIAGNOSIS — H9312 Tinnitus, left ear: Secondary | ICD-10-CM | POA: Diagnosis not present

## 2019-07-18 DIAGNOSIS — I10 Essential (primary) hypertension: Secondary | ICD-10-CM | POA: Diagnosis not present

## 2019-07-18 DIAGNOSIS — R7303 Prediabetes: Secondary | ICD-10-CM | POA: Diagnosis not present

## 2019-07-18 DIAGNOSIS — Z6841 Body Mass Index (BMI) 40.0 and over, adult: Secondary | ICD-10-CM | POA: Diagnosis not present

## 2019-07-18 DIAGNOSIS — M1712 Unilateral primary osteoarthritis, left knee: Secondary | ICD-10-CM | POA: Diagnosis not present

## 2019-07-18 DIAGNOSIS — R69 Illness, unspecified: Secondary | ICD-10-CM | POA: Diagnosis not present

## 2019-07-29 DIAGNOSIS — H9313 Tinnitus, bilateral: Secondary | ICD-10-CM | POA: Diagnosis not present

## 2019-07-29 DIAGNOSIS — H9312 Tinnitus, left ear: Secondary | ICD-10-CM | POA: Diagnosis not present

## 2019-07-31 DIAGNOSIS — R7301 Impaired fasting glucose: Secondary | ICD-10-CM | POA: Diagnosis not present

## 2019-07-31 DIAGNOSIS — E782 Mixed hyperlipidemia: Secondary | ICD-10-CM | POA: Diagnosis not present

## 2019-07-31 DIAGNOSIS — R635 Abnormal weight gain: Secondary | ICD-10-CM | POA: Diagnosis not present

## 2019-07-31 DIAGNOSIS — R7303 Prediabetes: Secondary | ICD-10-CM | POA: Diagnosis not present

## 2019-08-07 DIAGNOSIS — Z6841 Body Mass Index (BMI) 40.0 and over, adult: Secondary | ICD-10-CM | POA: Diagnosis not present

## 2019-08-07 DIAGNOSIS — M2559 Pain in other specified joint: Secondary | ICD-10-CM | POA: Diagnosis not present

## 2019-08-07 DIAGNOSIS — E782 Mixed hyperlipidemia: Secondary | ICD-10-CM | POA: Diagnosis not present

## 2019-08-07 DIAGNOSIS — I10 Essential (primary) hypertension: Secondary | ICD-10-CM | POA: Diagnosis not present

## 2019-08-07 DIAGNOSIS — Z1331 Encounter for screening for depression: Secondary | ICD-10-CM | POA: Diagnosis not present

## 2019-08-07 DIAGNOSIS — Z1339 Encounter for screening examination for other mental health and behavioral disorders: Secondary | ICD-10-CM | POA: Diagnosis not present

## 2019-08-07 DIAGNOSIS — Z72820 Sleep deprivation: Secondary | ICD-10-CM | POA: Diagnosis not present

## 2019-08-07 DIAGNOSIS — R69 Illness, unspecified: Secondary | ICD-10-CM | POA: Diagnosis not present

## 2019-08-19 DIAGNOSIS — E782 Mixed hyperlipidemia: Secondary | ICD-10-CM | POA: Diagnosis not present

## 2019-08-19 DIAGNOSIS — Z6841 Body Mass Index (BMI) 40.0 and over, adult: Secondary | ICD-10-CM | POA: Diagnosis not present

## 2019-08-26 DIAGNOSIS — I10 Essential (primary) hypertension: Secondary | ICD-10-CM | POA: Diagnosis not present

## 2019-08-26 DIAGNOSIS — Z6841 Body Mass Index (BMI) 40.0 and over, adult: Secondary | ICD-10-CM | POA: Diagnosis not present

## 2019-08-29 ENCOUNTER — Ambulatory Visit
Admission: RE | Admit: 2019-08-29 | Discharge: 2019-08-29 | Disposition: A | Discharge status: Home / Self Care | Payer: Medicare HMO | Source: Ambulatory Visit | Attending: Family Medicine | Admitting: Family Medicine

## 2019-08-29 ENCOUNTER — Other Ambulatory Visit: Payer: Self-pay | Admitting: Family Medicine

## 2019-08-29 DIAGNOSIS — M545 Low back pain, unspecified: Secondary | ICD-10-CM

## 2019-08-29 DIAGNOSIS — M25551 Pain in right hip: Secondary | ICD-10-CM | POA: Diagnosis not present

## 2019-08-29 DIAGNOSIS — M549 Dorsalgia, unspecified: Secondary | ICD-10-CM | POA: Diagnosis not present

## 2019-09-04 DIAGNOSIS — Z6841 Body Mass Index (BMI) 40.0 and over, adult: Secondary | ICD-10-CM | POA: Diagnosis not present

## 2019-09-04 DIAGNOSIS — R7301 Impaired fasting glucose: Secondary | ICD-10-CM | POA: Diagnosis not present

## 2019-09-11 DIAGNOSIS — I10 Essential (primary) hypertension: Secondary | ICD-10-CM | POA: Diagnosis not present

## 2019-09-11 DIAGNOSIS — Z6841 Body Mass Index (BMI) 40.0 and over, adult: Secondary | ICD-10-CM | POA: Diagnosis not present

## 2019-09-19 DIAGNOSIS — Z6841 Body Mass Index (BMI) 40.0 and over, adult: Secondary | ICD-10-CM | POA: Diagnosis not present

## 2019-09-19 DIAGNOSIS — E782 Mixed hyperlipidemia: Secondary | ICD-10-CM | POA: Diagnosis not present

## 2019-09-25 DIAGNOSIS — Z6841 Body Mass Index (BMI) 40.0 and over, adult: Secondary | ICD-10-CM | POA: Diagnosis not present

## 2019-09-25 DIAGNOSIS — R7303 Prediabetes: Secondary | ICD-10-CM | POA: Diagnosis not present

## 2019-09-25 DIAGNOSIS — I10 Essential (primary) hypertension: Secondary | ICD-10-CM | POA: Diagnosis not present

## 2019-09-30 ENCOUNTER — Other Ambulatory Visit: Payer: Self-pay | Admitting: Gastroenterology

## 2019-09-30 DIAGNOSIS — K743 Primary biliary cirrhosis: Secondary | ICD-10-CM

## 2019-10-01 DIAGNOSIS — M19071 Primary osteoarthritis, right ankle and foot: Secondary | ICD-10-CM | POA: Diagnosis not present

## 2019-10-01 DIAGNOSIS — R69 Illness, unspecified: Secondary | ICD-10-CM | POA: Diagnosis not present

## 2019-10-01 DIAGNOSIS — M810 Age-related osteoporosis without current pathological fracture: Secondary | ICD-10-CM | POA: Diagnosis not present

## 2019-10-01 DIAGNOSIS — E7800 Pure hypercholesterolemia, unspecified: Secondary | ICD-10-CM | POA: Diagnosis not present

## 2019-10-01 DIAGNOSIS — I10 Essential (primary) hypertension: Secondary | ICD-10-CM | POA: Diagnosis not present

## 2019-10-01 DIAGNOSIS — M1712 Unilateral primary osteoarthritis, left knee: Secondary | ICD-10-CM | POA: Diagnosis not present

## 2019-10-10 ENCOUNTER — Ambulatory Visit
Admission: RE | Admit: 2019-10-10 | Discharge: 2019-10-10 | Disposition: A | Discharge status: Home / Self Care | Payer: Medicare HMO | Source: Ambulatory Visit | Attending: Gastroenterology | Admitting: Gastroenterology

## 2019-10-10 DIAGNOSIS — K7689 Other specified diseases of liver: Secondary | ICD-10-CM | POA: Diagnosis not present

## 2019-10-10 DIAGNOSIS — K743 Primary biliary cirrhosis: Secondary | ICD-10-CM

## 2019-10-10 DIAGNOSIS — Z6841 Body Mass Index (BMI) 40.0 and over, adult: Secondary | ICD-10-CM | POA: Diagnosis not present

## 2019-10-10 DIAGNOSIS — R7303 Prediabetes: Secondary | ICD-10-CM | POA: Diagnosis not present

## 2019-10-16 DIAGNOSIS — Z6841 Body Mass Index (BMI) 40.0 and over, adult: Secondary | ICD-10-CM | POA: Diagnosis not present

## 2019-10-16 DIAGNOSIS — I10 Essential (primary) hypertension: Secondary | ICD-10-CM | POA: Diagnosis not present

## 2019-11-06 DIAGNOSIS — Z6841 Body Mass Index (BMI) 40.0 and over, adult: Secondary | ICD-10-CM | POA: Diagnosis not present

## 2019-11-06 DIAGNOSIS — R7303 Prediabetes: Secondary | ICD-10-CM | POA: Diagnosis not present

## 2019-11-06 DIAGNOSIS — I10 Essential (primary) hypertension: Secondary | ICD-10-CM | POA: Diagnosis not present

## 2019-11-13 DIAGNOSIS — E782 Mixed hyperlipidemia: Secondary | ICD-10-CM | POA: Diagnosis not present

## 2019-11-13 DIAGNOSIS — Z6841 Body Mass Index (BMI) 40.0 and over, adult: Secondary | ICD-10-CM | POA: Diagnosis not present

## 2019-11-18 DIAGNOSIS — M533 Sacrococcygeal disorders, not elsewhere classified: Secondary | ICD-10-CM | POA: Diagnosis not present

## 2019-11-20 DIAGNOSIS — Z6841 Body Mass Index (BMI) 40.0 and over, adult: Secondary | ICD-10-CM | POA: Diagnosis not present

## 2019-11-20 DIAGNOSIS — I10 Essential (primary) hypertension: Secondary | ICD-10-CM | POA: Diagnosis not present

## 2019-11-27 DIAGNOSIS — Z6841 Body Mass Index (BMI) 40.0 and over, adult: Secondary | ICD-10-CM | POA: Diagnosis not present

## 2019-11-27 DIAGNOSIS — I10 Essential (primary) hypertension: Secondary | ICD-10-CM | POA: Diagnosis not present

## 2019-12-02 DIAGNOSIS — Z1159 Encounter for screening for other viral diseases: Secondary | ICD-10-CM | POA: Diagnosis not present

## 2019-12-04 DIAGNOSIS — E782 Mixed hyperlipidemia: Secondary | ICD-10-CM | POA: Diagnosis not present

## 2019-12-04 DIAGNOSIS — Z6841 Body Mass Index (BMI) 40.0 and over, adult: Secondary | ICD-10-CM | POA: Diagnosis not present

## 2019-12-05 DIAGNOSIS — K7460 Unspecified cirrhosis of liver: Secondary | ICD-10-CM | POA: Diagnosis not present

## 2019-12-08 DIAGNOSIS — R69 Illness, unspecified: Secondary | ICD-10-CM | POA: Diagnosis not present

## 2019-12-11 DIAGNOSIS — R7303 Prediabetes: Secondary | ICD-10-CM | POA: Diagnosis not present

## 2019-12-11 DIAGNOSIS — Z6841 Body Mass Index (BMI) 40.0 and over, adult: Secondary | ICD-10-CM | POA: Diagnosis not present

## 2019-12-18 DIAGNOSIS — N762 Acute vulvitis: Secondary | ICD-10-CM | POA: Diagnosis not present

## 2019-12-18 DIAGNOSIS — E782 Mixed hyperlipidemia: Secondary | ICD-10-CM | POA: Diagnosis not present

## 2019-12-18 DIAGNOSIS — Z6841 Body Mass Index (BMI) 40.0 and over, adult: Secondary | ICD-10-CM | POA: Diagnosis not present

## 2019-12-18 DIAGNOSIS — I10 Essential (primary) hypertension: Secondary | ICD-10-CM | POA: Diagnosis not present

## 2019-12-18 DIAGNOSIS — R7301 Impaired fasting glucose: Secondary | ICD-10-CM | POA: Diagnosis not present

## 2019-12-18 DIAGNOSIS — N390 Urinary tract infection, site not specified: Secondary | ICD-10-CM | POA: Diagnosis not present

## 2019-12-27 DIAGNOSIS — M545 Low back pain: Secondary | ICD-10-CM | POA: Diagnosis not present

## 2020-01-01 DIAGNOSIS — K743 Primary biliary cirrhosis: Secondary | ICD-10-CM | POA: Diagnosis not present

## 2020-01-01 DIAGNOSIS — Z6841 Body Mass Index (BMI) 40.0 and over, adult: Secondary | ICD-10-CM | POA: Diagnosis not present

## 2020-01-01 DIAGNOSIS — E782 Mixed hyperlipidemia: Secondary | ICD-10-CM | POA: Diagnosis not present

## 2020-01-28 DIAGNOSIS — Z0000 Encounter for general adult medical examination without abnormal findings: Secondary | ICD-10-CM | POA: Diagnosis not present

## 2020-01-28 DIAGNOSIS — R69 Illness, unspecified: Secondary | ICD-10-CM | POA: Diagnosis not present

## 2020-01-28 DIAGNOSIS — R7303 Prediabetes: Secondary | ICD-10-CM | POA: Diagnosis not present

## 2020-01-28 DIAGNOSIS — I10 Essential (primary) hypertension: Secondary | ICD-10-CM | POA: Diagnosis not present

## 2020-01-28 DIAGNOSIS — E7800 Pure hypercholesterolemia, unspecified: Secondary | ICD-10-CM | POA: Diagnosis not present

## 2020-01-28 DIAGNOSIS — Z6841 Body Mass Index (BMI) 40.0 and over, adult: Secondary | ICD-10-CM | POA: Diagnosis not present

## 2020-01-28 DIAGNOSIS — M810 Age-related osteoporosis without current pathological fracture: Secondary | ICD-10-CM | POA: Diagnosis not present

## 2020-01-29 DIAGNOSIS — Z6841 Body Mass Index (BMI) 40.0 and over, adult: Secondary | ICD-10-CM | POA: Diagnosis not present

## 2020-01-29 DIAGNOSIS — I10 Essential (primary) hypertension: Secondary | ICD-10-CM | POA: Diagnosis not present

## 2020-02-07 DIAGNOSIS — Z1231 Encounter for screening mammogram for malignant neoplasm of breast: Secondary | ICD-10-CM | POA: Diagnosis not present

## 2020-02-07 DIAGNOSIS — M810 Age-related osteoporosis without current pathological fracture: Secondary | ICD-10-CM | POA: Diagnosis not present

## 2020-02-07 DIAGNOSIS — Z803 Family history of malignant neoplasm of breast: Secondary | ICD-10-CM | POA: Diagnosis not present

## 2020-03-11 DIAGNOSIS — M810 Age-related osteoporosis without current pathological fracture: Secondary | ICD-10-CM | POA: Diagnosis not present

## 2020-04-22 ENCOUNTER — Other Ambulatory Visit: Payer: Self-pay | Admitting: Gastroenterology

## 2020-04-22 DIAGNOSIS — K7469 Other cirrhosis of liver: Secondary | ICD-10-CM

## 2020-05-05 ENCOUNTER — Ambulatory Visit
Admission: RE | Admit: 2020-05-05 | Discharge: 2020-05-05 | Disposition: A | Discharge status: Home / Self Care | Payer: Medicare HMO | Source: Ambulatory Visit | Attending: Gastroenterology | Admitting: Gastroenterology

## 2020-05-05 DIAGNOSIS — K7469 Other cirrhosis of liver: Secondary | ICD-10-CM

## 2020-10-21 ENCOUNTER — Other Ambulatory Visit: Payer: Self-pay | Admitting: Internal Medicine

## 2020-10-21 DIAGNOSIS — K743 Primary biliary cirrhosis: Secondary | ICD-10-CM | POA: Diagnosis not present

## 2020-10-21 DIAGNOSIS — M79605 Pain in left leg: Secondary | ICD-10-CM

## 2020-10-26 ENCOUNTER — Ambulatory Visit
Admission: RE | Admit: 2020-10-26 | Discharge: 2020-10-26 | Disposition: A | Discharge status: Home / Self Care | Payer: Medicare HMO | Source: Ambulatory Visit | Attending: Internal Medicine | Admitting: Internal Medicine

## 2020-10-26 ENCOUNTER — Other Ambulatory Visit: Payer: Self-pay

## 2020-10-26 DIAGNOSIS — M79662 Pain in left lower leg: Secondary | ICD-10-CM | POA: Diagnosis not present

## 2020-10-26 DIAGNOSIS — M79605 Pain in left leg: Secondary | ICD-10-CM

## 2020-12-15 ENCOUNTER — Other Ambulatory Visit: Payer: Self-pay | Admitting: Gastroenterology

## 2020-12-15 DIAGNOSIS — K7460 Unspecified cirrhosis of liver: Secondary | ICD-10-CM

## 2020-12-21 ENCOUNTER — Ambulatory Visit
Admission: RE | Admit: 2020-12-21 | Discharge: 2020-12-21 | Disposition: A | Discharge status: Home / Self Care | Payer: Medicare HMO | Source: Ambulatory Visit | Attending: Gastroenterology | Admitting: Gastroenterology

## 2020-12-21 DIAGNOSIS — K7460 Unspecified cirrhosis of liver: Secondary | ICD-10-CM

## 2021-01-27 DIAGNOSIS — E559 Vitamin D deficiency, unspecified: Secondary | ICD-10-CM | POA: Diagnosis not present

## 2021-01-27 DIAGNOSIS — R5383 Other fatigue: Secondary | ICD-10-CM | POA: Diagnosis not present

## 2021-01-27 DIAGNOSIS — M810 Age-related osteoporosis without current pathological fracture: Secondary | ICD-10-CM | POA: Diagnosis not present

## 2021-02-03 DIAGNOSIS — Z7189 Other specified counseling: Secondary | ICD-10-CM | POA: Diagnosis not present

## 2021-02-03 DIAGNOSIS — K743 Primary biliary cirrhosis: Secondary | ICD-10-CM | POA: Diagnosis not present

## 2021-02-03 DIAGNOSIS — Z23 Encounter for immunization: Secondary | ICD-10-CM | POA: Diagnosis not present

## 2021-02-03 DIAGNOSIS — K219 Gastro-esophageal reflux disease without esophagitis: Secondary | ICD-10-CM | POA: Diagnosis not present

## 2021-02-03 DIAGNOSIS — D696 Thrombocytopenia, unspecified: Secondary | ICD-10-CM | POA: Diagnosis not present

## 2021-02-03 DIAGNOSIS — Z0000 Encounter for general adult medical examination without abnormal findings: Secondary | ICD-10-CM | POA: Diagnosis not present

## 2021-02-03 DIAGNOSIS — I10 Essential (primary) hypertension: Secondary | ICD-10-CM | POA: Diagnosis not present

## 2021-02-03 DIAGNOSIS — E669 Obesity, unspecified: Secondary | ICD-10-CM | POA: Diagnosis not present

## 2021-02-04 DIAGNOSIS — Z0000 Encounter for general adult medical examination without abnormal findings: Secondary | ICD-10-CM | POA: Diagnosis not present

## 2021-02-04 DIAGNOSIS — I10 Essential (primary) hypertension: Secondary | ICD-10-CM | POA: Diagnosis not present

## 2021-02-10 DIAGNOSIS — E559 Vitamin D deficiency, unspecified: Secondary | ICD-10-CM | POA: Diagnosis not present

## 2021-02-10 DIAGNOSIS — M810 Age-related osteoporosis without current pathological fracture: Secondary | ICD-10-CM | POA: Diagnosis not present

## 2021-02-18 DIAGNOSIS — K219 Gastro-esophageal reflux disease without esophagitis: Secondary | ICD-10-CM | POA: Diagnosis not present

## 2021-02-18 DIAGNOSIS — D649 Anemia, unspecified: Secondary | ICD-10-CM | POA: Diagnosis not present

## 2021-02-18 DIAGNOSIS — K743 Primary biliary cirrhosis: Secondary | ICD-10-CM | POA: Diagnosis not present

## 2021-02-18 DIAGNOSIS — K625 Hemorrhage of anus and rectum: Secondary | ICD-10-CM | POA: Diagnosis not present

## 2021-02-18 DIAGNOSIS — Z1231 Encounter for screening mammogram for malignant neoplasm of breast: Secondary | ICD-10-CM | POA: Diagnosis not present

## 2021-03-08 DIAGNOSIS — M25551 Pain in right hip: Secondary | ICD-10-CM | POA: Diagnosis not present

## 2021-03-08 DIAGNOSIS — K219 Gastro-esophageal reflux disease without esophagitis: Secondary | ICD-10-CM | POA: Diagnosis not present

## 2021-03-08 DIAGNOSIS — N898 Other specified noninflammatory disorders of vagina: Secondary | ICD-10-CM | POA: Diagnosis not present

## 2021-04-17 IMAGING — US US ABDOMEN LIMITED
1 series · 14 of 25 positions shown · non-contrast
Comparison: 10/10/2019

CLINICAL DATA: Cirrhosis.  Prior cholecystectomy.

EXAM:
ULTRASOUND ABDOMEN LIMITED RIGHT UPPER QUADRANT

[Series 1: us abdomen limited · 0.25mm/px · 14 of 31 slices shown]
[im 1/31]
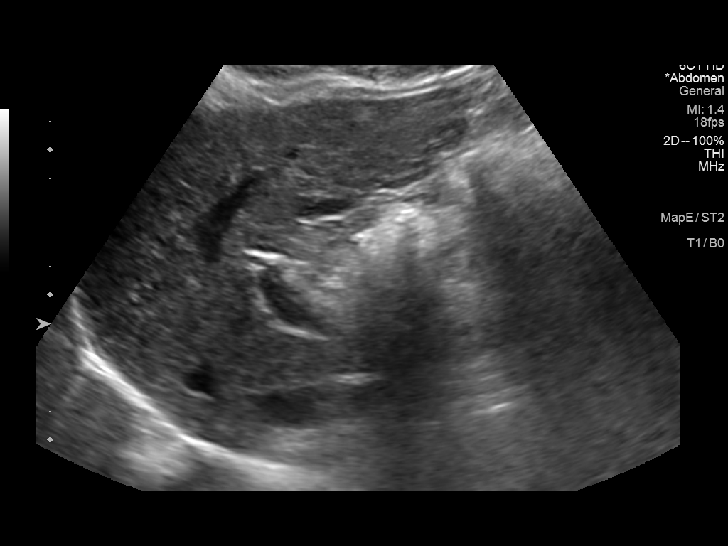
[im 3/31]
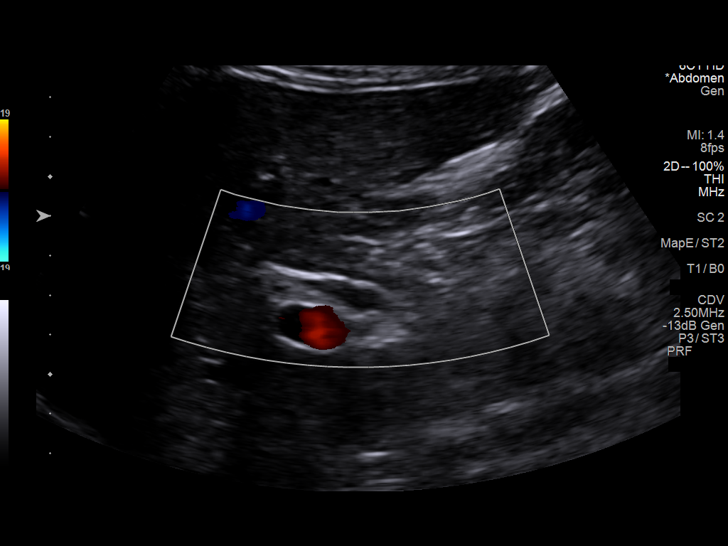
[im 6/31]
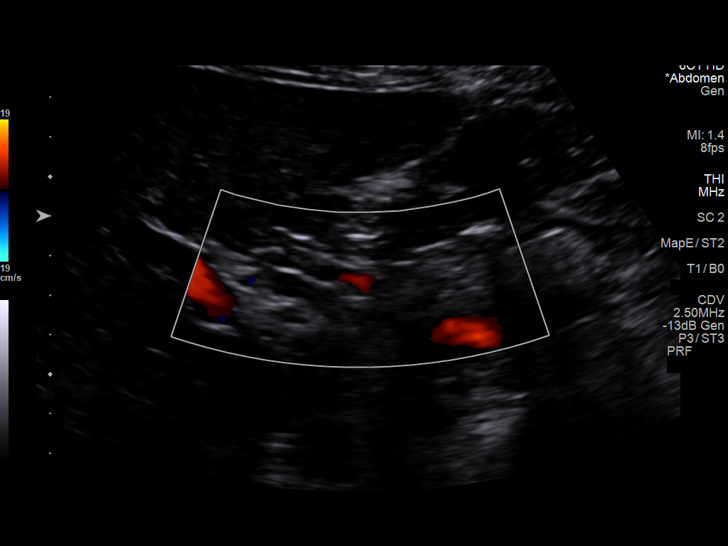
[im 8/31]
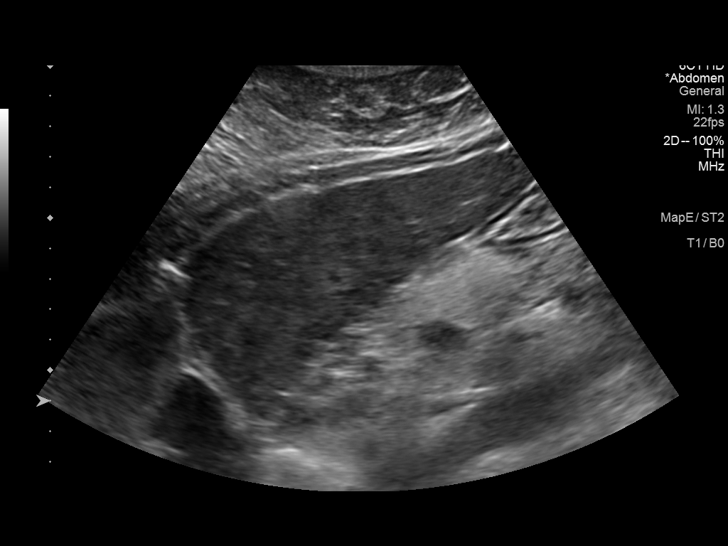
[im 11/31]
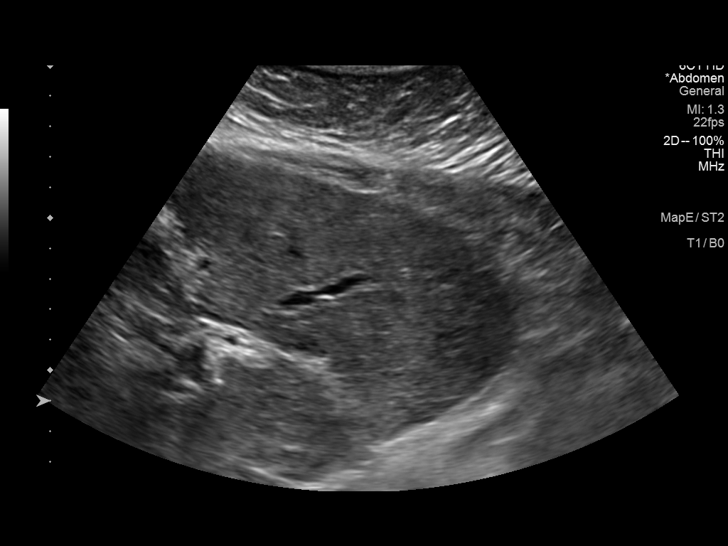
[im 12/31]
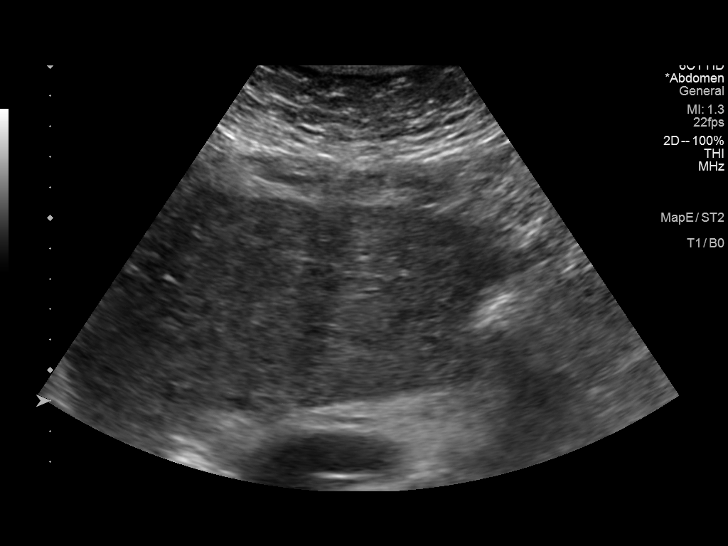
[im 14/31]
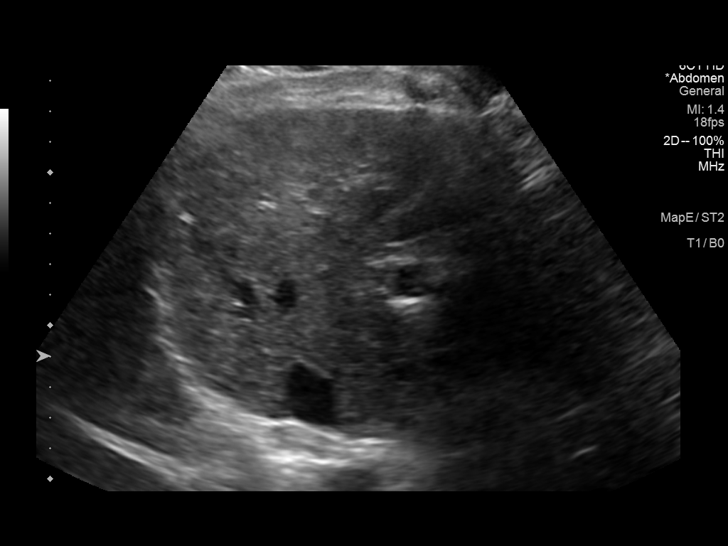
[im 17/31]
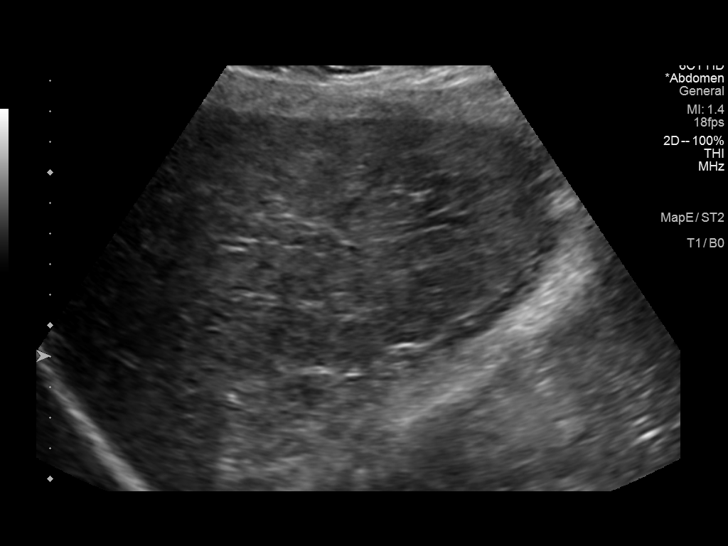
[im 19/31]
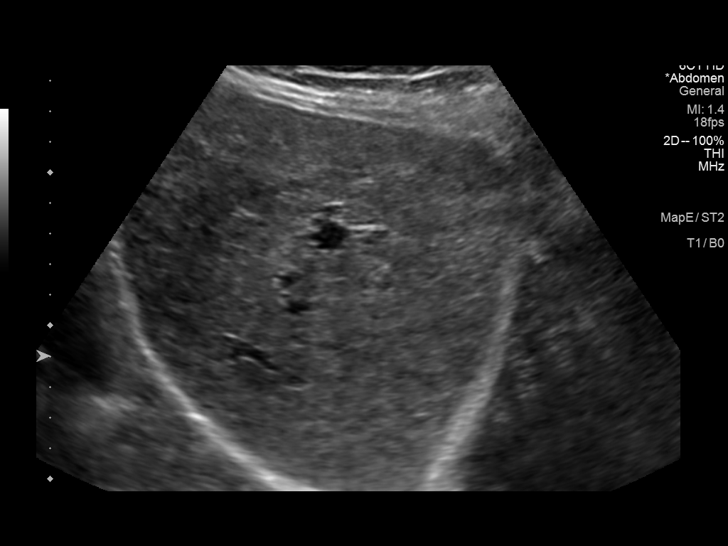
[im 21/31]
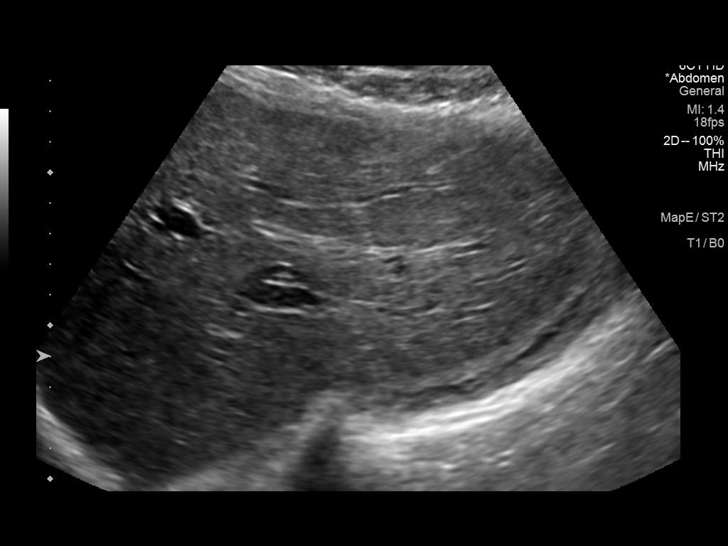
[im 23/31]
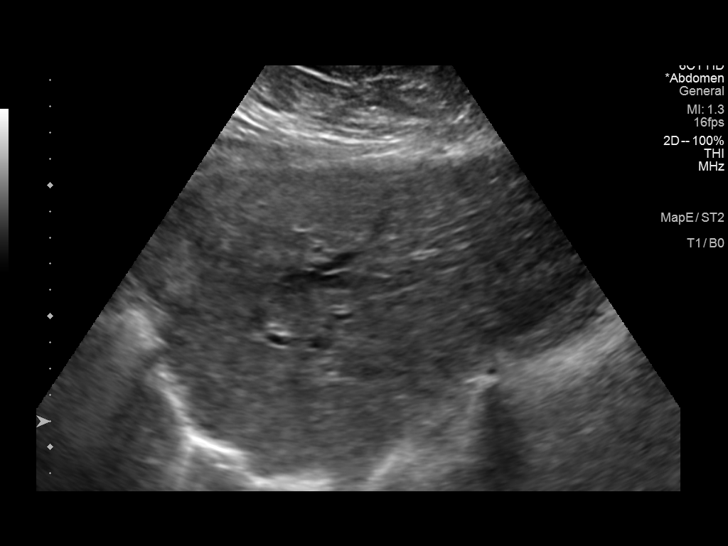
[im 26/31]
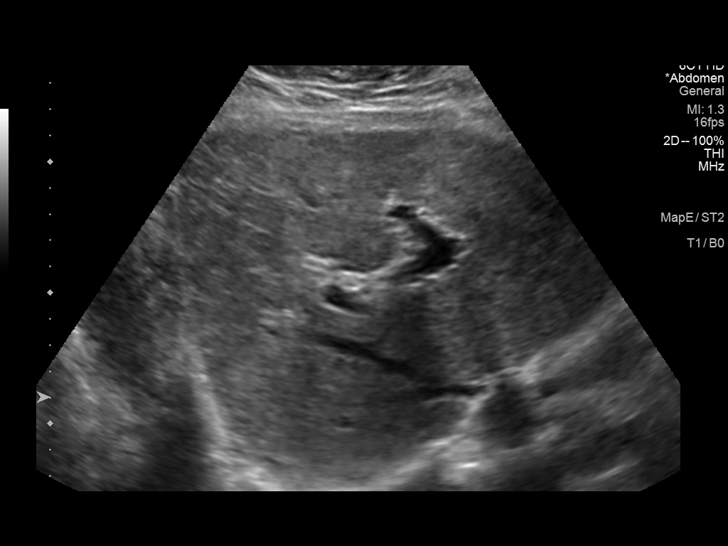
[im 28/31]
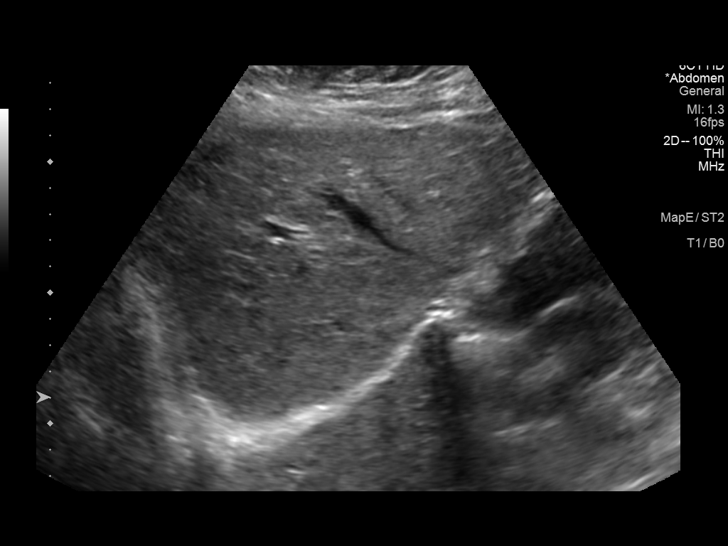
[im 31/31]
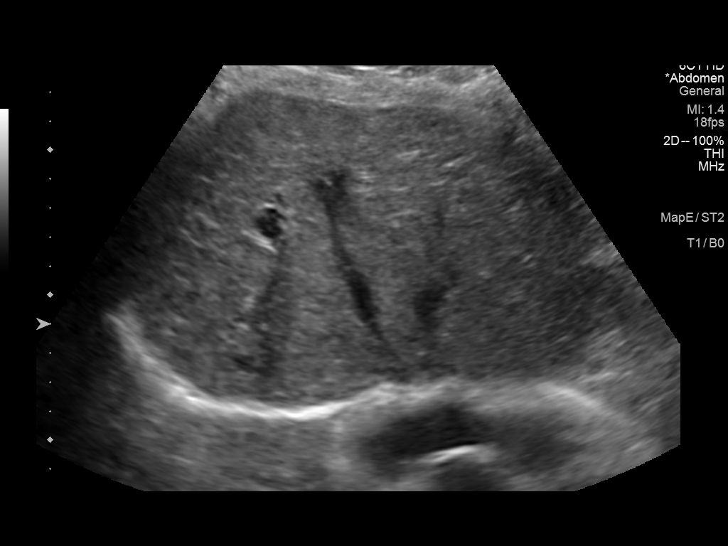

[14 of 25 positions shown; findings below may reference images not displayed]

FINDINGS: Gallbladder:

Surgically absent.

Common bile duct:

Diameter: 5-6 mm

Liver:

Heterogeneous parenchyma with lobular contour, findings consistent
with reported history of cirrhosis. No focal mass lesion. Portal
vein is patent on color Doppler imaging with normal direction of
blood flow towards the liver.

Other: None.
IMPRESSION: Stable exam. Heterogeneous nodular liver consistent with reported
history of cirrhosis. No focal hepatic mass lesion evident.

## 2021-06-07 ENCOUNTER — Other Ambulatory Visit: Payer: Self-pay | Admitting: Sports Medicine

## 2021-06-07 DIAGNOSIS — M545 Low back pain, unspecified: Secondary | ICD-10-CM | POA: Diagnosis not present

## 2021-06-07 DIAGNOSIS — K7460 Unspecified cirrhosis of liver: Secondary | ICD-10-CM | POA: Diagnosis not present

## 2021-06-07 DIAGNOSIS — M25551 Pain in right hip: Secondary | ICD-10-CM | POA: Diagnosis not present

## 2021-06-07 DIAGNOSIS — G8929 Other chronic pain: Secondary | ICD-10-CM

## 2021-06-10 ENCOUNTER — Other Ambulatory Visit: Payer: Self-pay | Admitting: Gastroenterology

## 2021-06-10 DIAGNOSIS — K7469 Other cirrhosis of liver: Secondary | ICD-10-CM

## 2021-06-12 DIAGNOSIS — B3731 Acute candidiasis of vulva and vagina: Secondary | ICD-10-CM | POA: Diagnosis not present

## 2021-06-19 ENCOUNTER — Other Ambulatory Visit: Payer: Self-pay

## 2021-06-19 ENCOUNTER — Ambulatory Visit
Admission: RE | Admit: 2021-06-19 | Discharge: 2021-06-19 | Disposition: A | Discharge status: Home / Self Care | Payer: Medicare HMO | Source: Ambulatory Visit | Attending: Sports Medicine | Admitting: Sports Medicine

## 2021-06-19 DIAGNOSIS — M25551 Pain in right hip: Secondary | ICD-10-CM | POA: Diagnosis not present

## 2021-06-19 DIAGNOSIS — M545 Low back pain, unspecified: Secondary | ICD-10-CM | POA: Diagnosis not present

## 2021-06-19 DIAGNOSIS — G8929 Other chronic pain: Secondary | ICD-10-CM

## 2021-06-21 ENCOUNTER — Other Ambulatory Visit: Payer: Self-pay | Admitting: Gastroenterology

## 2021-06-21 DIAGNOSIS — M545 Low back pain, unspecified: Secondary | ICD-10-CM | POA: Diagnosis not present

## 2021-06-21 DIAGNOSIS — K7460 Unspecified cirrhosis of liver: Secondary | ICD-10-CM

## 2021-06-28 ENCOUNTER — Ambulatory Visit
Admission: RE | Admit: 2021-06-28 | Discharge: 2021-06-28 | Disposition: A | Discharge status: Home / Self Care | Payer: Medicare HMO | Source: Ambulatory Visit | Attending: Gastroenterology | Admitting: Gastroenterology

## 2021-06-28 DIAGNOSIS — K7469 Other cirrhosis of liver: Secondary | ICD-10-CM

## 2021-06-28 DIAGNOSIS — K7460 Unspecified cirrhosis of liver: Secondary | ICD-10-CM | POA: Diagnosis not present

## 2021-06-28 DIAGNOSIS — Z9049 Acquired absence of other specified parts of digestive tract: Secondary | ICD-10-CM | POA: Diagnosis not present

## 2021-07-02 DIAGNOSIS — K743 Primary biliary cirrhosis: Secondary | ICD-10-CM | POA: Diagnosis not present

## 2021-07-02 DIAGNOSIS — K625 Hemorrhage of anus and rectum: Secondary | ICD-10-CM | POA: Diagnosis not present

## 2021-07-02 DIAGNOSIS — Z86010 Personal history of colonic polyps: Secondary | ICD-10-CM | POA: Diagnosis not present

## 2021-07-12 DIAGNOSIS — N1831 Chronic kidney disease, stage 3a: Secondary | ICD-10-CM | POA: Diagnosis not present

## 2021-07-12 DIAGNOSIS — M545 Low back pain, unspecified: Secondary | ICD-10-CM | POA: Diagnosis not present

## 2021-08-04 DIAGNOSIS — N1831 Chronic kidney disease, stage 3a: Secondary | ICD-10-CM | POA: Diagnosis not present

## 2021-08-04 DIAGNOSIS — R7303 Prediabetes: Secondary | ICD-10-CM | POA: Diagnosis not present

## 2021-08-04 DIAGNOSIS — I10 Essential (primary) hypertension: Secondary | ICD-10-CM | POA: Diagnosis not present

## 2021-08-04 DIAGNOSIS — M810 Age-related osteoporosis without current pathological fracture: Secondary | ICD-10-CM | POA: Diagnosis not present

## 2021-08-04 DIAGNOSIS — K743 Primary biliary cirrhosis: Secondary | ICD-10-CM | POA: Diagnosis not present

## 2021-08-11 DIAGNOSIS — R5383 Other fatigue: Secondary | ICD-10-CM | POA: Diagnosis not present

## 2021-08-11 DIAGNOSIS — M545 Low back pain, unspecified: Secondary | ICD-10-CM | POA: Diagnosis not present

## 2021-08-11 DIAGNOSIS — E559 Vitamin D deficiency, unspecified: Secondary | ICD-10-CM | POA: Diagnosis not present

## 2021-08-11 DIAGNOSIS — M810 Age-related osteoporosis without current pathological fracture: Secondary | ICD-10-CM | POA: Diagnosis not present

## 2021-09-20 DIAGNOSIS — K5730 Diverticulosis of large intestine without perforation or abscess without bleeding: Secondary | ICD-10-CM | POA: Diagnosis not present

## 2021-09-20 DIAGNOSIS — K648 Other hemorrhoids: Secondary | ICD-10-CM | POA: Diagnosis not present

## 2021-09-20 DIAGNOSIS — Z09 Encounter for follow-up examination after completed treatment for conditions other than malignant neoplasm: Secondary | ICD-10-CM | POA: Diagnosis not present

## 2021-09-20 DIAGNOSIS — Z86010 Personal history of colonic polyps: Secondary | ICD-10-CM | POA: Diagnosis not present

## 2021-09-20 DIAGNOSIS — K644 Residual hemorrhoidal skin tags: Secondary | ICD-10-CM | POA: Diagnosis not present

## 2021-10-08 DIAGNOSIS — I10 Essential (primary) hypertension: Secondary | ICD-10-CM | POA: Diagnosis not present

## 2021-10-08 DIAGNOSIS — K649 Unspecified hemorrhoids: Secondary | ICD-10-CM | POA: Diagnosis not present

## 2021-10-08 DIAGNOSIS — D638 Anemia in other chronic diseases classified elsewhere: Secondary | ICD-10-CM | POA: Diagnosis not present

## 2021-11-24 DIAGNOSIS — M25551 Pain in right hip: Secondary | ICD-10-CM | POA: Diagnosis not present

## 2021-11-24 DIAGNOSIS — M545 Low back pain, unspecified: Secondary | ICD-10-CM | POA: Diagnosis not present

## 2021-11-29 DIAGNOSIS — M545 Low back pain, unspecified: Secondary | ICD-10-CM | POA: Diagnosis not present

## 2021-11-29 DIAGNOSIS — M25551 Pain in right hip: Secondary | ICD-10-CM | POA: Diagnosis not present

## 2021-12-01 DIAGNOSIS — M545 Low back pain, unspecified: Secondary | ICD-10-CM | POA: Diagnosis not present

## 2021-12-01 DIAGNOSIS — M25551 Pain in right hip: Secondary | ICD-10-CM | POA: Diagnosis not present

## 2021-12-07 DIAGNOSIS — M545 Low back pain, unspecified: Secondary | ICD-10-CM | POA: Diagnosis not present

## 2021-12-07 DIAGNOSIS — M25551 Pain in right hip: Secondary | ICD-10-CM | POA: Diagnosis not present

## 2021-12-10 DIAGNOSIS — M545 Low back pain, unspecified: Secondary | ICD-10-CM | POA: Diagnosis not present

## 2021-12-10 DIAGNOSIS — M25551 Pain in right hip: Secondary | ICD-10-CM | POA: Diagnosis not present

## 2021-12-13 DIAGNOSIS — M545 Low back pain, unspecified: Secondary | ICD-10-CM | POA: Diagnosis not present

## 2021-12-13 DIAGNOSIS — M25551 Pain in right hip: Secondary | ICD-10-CM | POA: Diagnosis not present

## 2021-12-15 DIAGNOSIS — M25551 Pain in right hip: Secondary | ICD-10-CM | POA: Diagnosis not present

## 2021-12-15 DIAGNOSIS — M545 Low back pain, unspecified: Secondary | ICD-10-CM | POA: Diagnosis not present

## 2021-12-20 DIAGNOSIS — M545 Low back pain, unspecified: Secondary | ICD-10-CM | POA: Diagnosis not present

## 2021-12-20 DIAGNOSIS — M25551 Pain in right hip: Secondary | ICD-10-CM | POA: Diagnosis not present

## 2021-12-27 DIAGNOSIS — M545 Low back pain, unspecified: Secondary | ICD-10-CM | POA: Diagnosis not present

## 2021-12-27 DIAGNOSIS — M25551 Pain in right hip: Secondary | ICD-10-CM | POA: Diagnosis not present

## 2021-12-29 DIAGNOSIS — M545 Low back pain, unspecified: Secondary | ICD-10-CM | POA: Diagnosis not present

## 2021-12-29 DIAGNOSIS — M25551 Pain in right hip: Secondary | ICD-10-CM | POA: Diagnosis not present

## 2022-01-05 DIAGNOSIS — M25551 Pain in right hip: Secondary | ICD-10-CM | POA: Diagnosis not present

## 2022-01-05 DIAGNOSIS — M545 Low back pain, unspecified: Secondary | ICD-10-CM | POA: Diagnosis not present

## 2022-01-10 DIAGNOSIS — M25551 Pain in right hip: Secondary | ICD-10-CM | POA: Diagnosis not present

## 2022-01-10 DIAGNOSIS — M545 Low back pain, unspecified: Secondary | ICD-10-CM | POA: Diagnosis not present

## 2022-01-11 DIAGNOSIS — R5383 Other fatigue: Secondary | ICD-10-CM | POA: Diagnosis not present

## 2022-01-11 DIAGNOSIS — E559 Vitamin D deficiency, unspecified: Secondary | ICD-10-CM | POA: Diagnosis not present

## 2022-01-11 DIAGNOSIS — M810 Age-related osteoporosis without current pathological fracture: Secondary | ICD-10-CM | POA: Diagnosis not present

## 2022-01-13 DIAGNOSIS — I10 Essential (primary) hypertension: Secondary | ICD-10-CM | POA: Diagnosis not present

## 2022-01-13 DIAGNOSIS — G4700 Insomnia, unspecified: Secondary | ICD-10-CM | POA: Diagnosis not present

## 2022-01-13 DIAGNOSIS — M1712 Unilateral primary osteoarthritis, left knee: Secondary | ICD-10-CM | POA: Diagnosis not present

## 2022-01-13 DIAGNOSIS — F419 Anxiety disorder, unspecified: Secondary | ICD-10-CM | POA: Diagnosis not present

## 2022-01-13 DIAGNOSIS — Z23 Encounter for immunization: Secondary | ICD-10-CM | POA: Diagnosis not present

## 2022-01-13 DIAGNOSIS — K743 Primary biliary cirrhosis: Secondary | ICD-10-CM | POA: Diagnosis not present

## 2022-01-13 DIAGNOSIS — L989 Disorder of the skin and subcutaneous tissue, unspecified: Secondary | ICD-10-CM | POA: Diagnosis not present

## 2022-01-19 ENCOUNTER — Ambulatory Visit: Payer: Medicare HMO | Admitting: Podiatry

## 2022-01-26 ENCOUNTER — Encounter: Payer: Self-pay | Admitting: Podiatry

## 2022-01-26 ENCOUNTER — Ambulatory Visit: Payer: Medicare HMO | Admitting: Podiatry

## 2022-01-26 ENCOUNTER — Ambulatory Visit (INDEPENDENT_AMBULATORY_CARE_PROVIDER_SITE_OTHER): Payer: Medicare HMO

## 2022-01-26 DIAGNOSIS — M722 Plantar fascial fibromatosis: Secondary | ICD-10-CM

## 2022-01-26 DIAGNOSIS — M810 Age-related osteoporosis without current pathological fracture: Secondary | ICD-10-CM | POA: Insufficient documentation

## 2022-01-26 DIAGNOSIS — I10 Essential (primary) hypertension: Secondary | ICD-10-CM | POA: Insufficient documentation

## 2022-01-26 DIAGNOSIS — F3341 Major depressive disorder, recurrent, in partial remission: Secondary | ICD-10-CM | POA: Insufficient documentation

## 2022-01-26 DIAGNOSIS — K743 Primary biliary cirrhosis: Secondary | ICD-10-CM | POA: Insufficient documentation

## 2022-01-26 DIAGNOSIS — Z6841 Body Mass Index (BMI) 40.0 and over, adult: Secondary | ICD-10-CM | POA: Insufficient documentation

## 2022-01-26 DIAGNOSIS — F419 Anxiety disorder, unspecified: Secondary | ICD-10-CM | POA: Insufficient documentation

## 2022-01-26 DIAGNOSIS — M2559 Pain in other specified joint: Secondary | ICD-10-CM | POA: Insufficient documentation

## 2022-01-26 DIAGNOSIS — Z72820 Sleep deprivation: Secondary | ICD-10-CM | POA: Insufficient documentation

## 2022-01-26 DIAGNOSIS — E785 Hyperlipidemia, unspecified: Secondary | ICD-10-CM | POA: Insufficient documentation

## 2022-01-26 NOTE — Progress Notes (Signed)
Subjective:  Patient ID: Natalie Villarreal, female    DOB: September 24, 1960,  MRN: [id number]  Chief Complaint  Patient presents with   Foot Pain    Right foot pain pt stated that the pain is in her heel and arch been going on for about a year.     61 y.o. female presents with the above complaint.  Patient presents with right heel pain.  Patient states going on for quite some time.  She wanted to get it checked out.  Is been on for years progressive gotten worse worse with ambulation hurts with pressure she would like to discuss treatment options she has tried some over-the-counter insoles and shoe gear modification none of which has helped pain scale 7 out of 10 sharp shooting in nature   Review of Systems: Negative except as noted in the HPI. Denies N/V/F/Ch.  History reviewed. No pertinent past medical history.  Current Outpatient Medications:    cholestyramine (QUESTRAN) 4 GM/DOSE powder, 2 (two) times daily., Disp: , Rfl:    hydrochlorothiazide (HYDRODIURIL) 25 MG tablet, Take by mouth., Disp: , Rfl:    LORazepam (ATIVAN) 0.5 MG tablet, Take by mouth., Disp: , Rfl:    pantoprazole (PROTONIX) 40 MG tablet, Take by mouth., Disp: , Rfl:    ursodiol (ACTIGALL) 300 MG capsule, Take by mouth., Disp: , Rfl:    alendronate (FOSAMAX) 10 MG tablet, Take by mouth., Disp: , Rfl:    amLODipine (NORVASC) 10 MG tablet, Take 10 mg by mouth daily., Disp: , Rfl:    citalopram (CELEXA) 40 MG tablet, Take 40 mg by mouth daily., Disp: , Rfl:    clobetasol cream (TEMOVATE) 0.05 %, , Disp: , Rfl:    enalapril (VASOTEC) 10 MG tablet, Take by mouth., Disp: , Rfl:    KLOR-CON M20 20 MEQ tablet, Take by mouth., Disp: , Rfl:    methocarbamol (ROBAXIN) 500 MG tablet, Take 500 mg by mouth 2 (two) times daily., Disp: , Rfl:    PROLIA 60 MG/ML SOSY injection, Inject into the skin., Disp: , Rfl:    rosuvastatin (CRESTOR) 10 MG tablet, Take 10 mg by mouth at bedtime., Disp: , Rfl:    traZODone (DESYREL) 50 MG tablet, Take 50  mg by mouth at bedtime., Disp: , Rfl:   Social History   Tobacco Use  Smoking Status Not on file  Smokeless Tobacco Not on file    Allergies  Allergen Reactions   Ciprofloxacin Hives   Doxycycline     Other reaction(s): GI Intolerance, GI Upset (intolerance)   Pravastatin Itching    Other reaction(s): GI Upset (intolerance), Other (See Comments) MUSCLE PAIN MUSCLE PAIN MUSCLE PAIN MUSCLE PAIN    Objective:  There were no vitals filed for this visit. There is no height or weight on file to calculate BMI. Constitutional Well developed. Well nourished.  Vascular Dorsalis pedis pulses palpable bilaterally. Posterior tibial pulses palpable bilaterally. Capillary refill normal to all digits.  No cyanosis or clubbing noted. Pedal hair growth normal.  Neurologic Normal speech. Oriented to person, place, and time. Epicritic sensation to light touch grossly present bilaterally.  Dermatologic Nails well groomed and normal in appearance. No open wounds. No skin lesions.  Orthopedic: Normal joint ROM without pain or crepitus bilaterally. No visible deformities. Tender to palpation at the calcaneal tuber right No pain with calcaneal squeeze right. Ankle ROM diminished range of motion right. Silfverskiold Test: positive right.   Radiographs: Taken and reviewed. No acute fractures or dislocations. No evidence of  stress fracture.  Plantar heel spur present. Posterior heel spur present.  Pes planovalgus  Assessment:   1. Plantar fasciitis of right foot    Plan:  Patient was evaluated and treated and all questions answered.  Plantar Fasciitis, right - XR reviewed as above.  - Educated on icing and stretching. Instructions given.  - Injection delivered to the plantar fascia as below. - DME: Plantar fascial brace dispensed to support the medial longitudinal arch of the foot and offload pressure from the heel and prevent arch collapse during weightbearing - Pharmacologic  management: None  Procedure: Injection Tendon/Ligament Location: Right plantar fascia at the glabrous junction; medial approach. Skin Prep: alcohol Injectate: 0.5 cc 0.5% marcaine plain, 0.5 cc of 1% Lidocaine, 0.5 cc kenalog 10. Disposition: Patient tolerated procedure well. Injection site dressed with a band-aid.  No follow-ups on file.

## 2022-01-28 DIAGNOSIS — R7303 Prediabetes: Secondary | ICD-10-CM | POA: Diagnosis not present

## 2022-01-28 DIAGNOSIS — I10 Essential (primary) hypertension: Secondary | ICD-10-CM | POA: Diagnosis not present

## 2022-01-28 DIAGNOSIS — K743 Primary biliary cirrhosis: Secondary | ICD-10-CM | POA: Diagnosis not present

## 2022-01-28 DIAGNOSIS — N1831 Chronic kidney disease, stage 3a: Secondary | ICD-10-CM | POA: Diagnosis not present

## 2022-01-28 DIAGNOSIS — E876 Hypokalemia: Secondary | ICD-10-CM | POA: Diagnosis not present

## 2022-02-01 DIAGNOSIS — G4719 Other hypersomnia: Secondary | ICD-10-CM | POA: Diagnosis not present

## 2022-02-01 DIAGNOSIS — G4700 Insomnia, unspecified: Secondary | ICD-10-CM | POA: Diagnosis not present

## 2022-02-15 ENCOUNTER — Other Ambulatory Visit: Payer: Self-pay | Admitting: Gastroenterology

## 2022-02-15 DIAGNOSIS — M810 Age-related osteoporosis without current pathological fracture: Secondary | ICD-10-CM | POA: Diagnosis not present

## 2022-02-15 DIAGNOSIS — K7460 Unspecified cirrhosis of liver: Secondary | ICD-10-CM

## 2022-02-16 DIAGNOSIS — N1831 Chronic kidney disease, stage 3a: Secondary | ICD-10-CM | POA: Diagnosis not present

## 2022-02-16 DIAGNOSIS — Z0000 Encounter for general adult medical examination without abnormal findings: Secondary | ICD-10-CM | POA: Diagnosis not present

## 2022-02-16 DIAGNOSIS — M810 Age-related osteoporosis without current pathological fracture: Secondary | ICD-10-CM | POA: Diagnosis not present

## 2022-02-16 DIAGNOSIS — I10 Essential (primary) hypertension: Secondary | ICD-10-CM | POA: Diagnosis not present

## 2022-02-16 DIAGNOSIS — M1712 Unilateral primary osteoarthritis, left knee: Secondary | ICD-10-CM | POA: Diagnosis not present

## 2022-02-16 DIAGNOSIS — F419 Anxiety disorder, unspecified: Secondary | ICD-10-CM | POA: Diagnosis not present

## 2022-02-16 DIAGNOSIS — K743 Primary biliary cirrhosis: Secondary | ICD-10-CM | POA: Diagnosis not present

## 2022-02-16 DIAGNOSIS — R7309 Other abnormal glucose: Secondary | ICD-10-CM | POA: Diagnosis not present

## 2022-02-23 ENCOUNTER — Ambulatory Visit (INDEPENDENT_AMBULATORY_CARE_PROVIDER_SITE_OTHER): Payer: Medicare HMO | Admitting: Podiatry

## 2022-02-23 DIAGNOSIS — Z1231 Encounter for screening mammogram for malignant neoplasm of breast: Secondary | ICD-10-CM | POA: Diagnosis not present

## 2022-02-23 DIAGNOSIS — M85852 Other specified disorders of bone density and structure, left thigh: Secondary | ICD-10-CM | POA: Diagnosis not present

## 2022-02-23 DIAGNOSIS — M85851 Other specified disorders of bone density and structure, right thigh: Secondary | ICD-10-CM | POA: Diagnosis not present

## 2022-02-23 DIAGNOSIS — M722 Plantar fascial fibromatosis: Secondary | ICD-10-CM | POA: Diagnosis not present

## 2022-02-23 DIAGNOSIS — M810 Age-related osteoporosis without current pathological fracture: Secondary | ICD-10-CM | POA: Diagnosis not present

## 2022-02-23 NOTE — Progress Notes (Signed)
Subjective:  Patient ID: Natalie Villarreal, female    DOB: [birth year],  MRN: [id number]  Chief Complaint  Patient presents with   Plantar Fasciitis    Right foot plantar fasciitis, patient is feeling much better having less pain, rate of pain 4 out of 10, TX: Brace Patient would like a injection today     61 y.o. female presents with the above complaint.  Patient presents with follow-up of right Planter fasciitis.  She states that she is doing a lot better pain scale is 4 out of 10 the injection helped the brace helped.  She would like to discuss next treatment plan.  e   Review of Systems: Negative except as noted in the HPI. Denies N/V/F/Ch.  No past medical history on file.  Current Outpatient Medications:    alendronate (FOSAMAX) 10 MG tablet, Take by mouth., Disp: , Rfl:    amLODipine (NORVASC) 10 MG tablet, Take 10 mg by mouth daily., Disp: , Rfl:    cholestyramine (QUESTRAN) 4 GM/DOSE powder, 2 (two) times daily., Disp: , Rfl:    citalopram (CELEXA) 40 MG tablet, Take 40 mg by mouth daily., Disp: , Rfl:    clobetasol cream (TEMOVATE) 0.05 %, , Disp: , Rfl:    enalapril (VASOTEC) 10 MG tablet, Take by mouth., Disp: , Rfl:    hydrochlorothiazide (HYDRODIURIL) 25 MG tablet, Take by mouth., Disp: , Rfl:    KLOR-CON M20 20 MEQ tablet, Take by mouth., Disp: , Rfl:    LORazepam (ATIVAN) 0.5 MG tablet, Take by mouth., Disp: , Rfl:    methocarbamol (ROBAXIN) 500 MG tablet, Take 500 mg by mouth 2 (two) times daily., Disp: , Rfl:    pantoprazole (PROTONIX) 40 MG tablet, Take by mouth., Disp: , Rfl:    PROLIA 60 MG/ML SOSY injection, Inject into the skin., Disp: , Rfl:    rosuvastatin (CRESTOR) 10 MG tablet, Take 10 mg by mouth at bedtime., Disp: , Rfl:    traZODone (DESYREL) 50 MG tablet, Take 50 mg by mouth at bedtime., Disp: , Rfl:    ursodiol (ACTIGALL) 300 MG capsule, Take by mouth., Disp: , Rfl:   Social History   Tobacco Use  Smoking Status Not on file  Smokeless Tobacco Not on file     Allergies  Allergen Reactions   Ciprofloxacin Hives   Doxycycline     Other reaction(s): GI Intolerance, GI Upset (intolerance)   Pravastatin Itching    Other reaction(s): GI Upset (intolerance), Other (See Comments) MUSCLE PAIN MUSCLE PAIN MUSCLE PAIN MUSCLE PAIN    Objective:  There were no vitals filed for this visit. There is no height or weight on file to calculate BMI. Constitutional Well developed. Well nourished.  Vascular Dorsalis pedis pulses palpable bilaterally. Posterior tibial pulses palpable bilaterally. Capillary refill normal to all digits.  No cyanosis or clubbing noted. Pedal hair growth normal.  Neurologic Normal speech. Oriented to person, place, and time. Epicritic sensation to light touch grossly present bilaterally.  Dermatologic Nails well groomed and normal in appearance. No open wounds. No skin lesions.  Orthopedic: Normal joint ROM without pain or crepitus bilaterally. No visible deformities. Tender to palpation at the calcaneal tuber right No pain with calcaneal squeeze right. Ankle ROM diminished range of motion right. Silfverskiold Test: positive right.   Radiographs: Taken and reviewed. No acute fractures or dislocations. No evidence of stress fracture.  Plantar heel spur present. Posterior heel spur present.  Pes planovalgus  Assessment:   1. Plantar fasciitis of right  foot    Plan:  Patient was evaluated and treated and all questions answered.  Plantar Fasciitis, right - XR reviewed as above.  - Educated on icing and stretching. Instructions given.  -Second injection delivered to the plantar fascia as below. - DME: Continue plantar fascial brace.  Night splint was dispensed - Pharmacologic management: None -I discussed with the patient that she will benefit from power steps.  Procedure: Injection Tendon/Ligament Location: Right plantar fascia at the glabrous junction; medial approach. Skin Prep: alcohol Injectate: 0.5 cc  0.5% marcaine plain, 0.5 cc of 1% Lidocaine, 0.5 cc kenalog 10. Disposition: Patient tolerated procedure well. Injection site dressed with a band-aid.  No follow-ups on file.

## 2022-02-28 ENCOUNTER — Other Ambulatory Visit: Payer: Medicare HMO

## 2022-02-28 ENCOUNTER — Ambulatory Visit
Admission: RE | Admit: 2022-02-28 | Discharge: 2022-02-28 | Disposition: A | Discharge status: Home / Self Care | Payer: Medicare HMO | Source: Ambulatory Visit | Attending: Gastroenterology | Admitting: Gastroenterology

## 2022-02-28 DIAGNOSIS — K7460 Unspecified cirrhosis of liver: Secondary | ICD-10-CM | POA: Diagnosis not present

## 2022-03-08 DIAGNOSIS — G4733 Obstructive sleep apnea (adult) (pediatric): Secondary | ICD-10-CM | POA: Diagnosis not present

## 2022-03-08 DIAGNOSIS — R92321 Mammographic fibroglandular density, right breast: Secondary | ICD-10-CM | POA: Diagnosis not present

## 2022-03-09 DIAGNOSIS — G4733 Obstructive sleep apnea (adult) (pediatric): Secondary | ICD-10-CM | POA: Diagnosis not present

## 2022-03-09 DIAGNOSIS — H524 Presbyopia: Secondary | ICD-10-CM | POA: Diagnosis not present

## 2022-03-14 DIAGNOSIS — I10 Essential (primary) hypertension: Secondary | ICD-10-CM | POA: Diagnosis not present

## 2022-03-14 DIAGNOSIS — M810 Age-related osteoporosis without current pathological fracture: Secondary | ICD-10-CM | POA: Diagnosis not present

## 2022-03-14 DIAGNOSIS — K743 Primary biliary cirrhosis: Secondary | ICD-10-CM | POA: Diagnosis not present

## 2022-03-14 DIAGNOSIS — F3341 Major depressive disorder, recurrent, in partial remission: Secondary | ICD-10-CM | POA: Diagnosis not present

## 2022-04-27 DIAGNOSIS — I10 Essential (primary) hypertension: Secondary | ICD-10-CM | POA: Diagnosis not present

## 2022-04-27 DIAGNOSIS — E7800 Pure hypercholesterolemia, unspecified: Secondary | ICD-10-CM | POA: Diagnosis not present

## 2022-04-27 DIAGNOSIS — R5383 Other fatigue: Secondary | ICD-10-CM | POA: Diagnosis not present

## 2022-04-27 DIAGNOSIS — F3341 Major depressive disorder, recurrent, in partial remission: Secondary | ICD-10-CM | POA: Diagnosis not present

## 2022-04-27 DIAGNOSIS — E8889 Other specified metabolic disorders: Secondary | ICD-10-CM | POA: Diagnosis not present

## 2022-04-27 DIAGNOSIS — K219 Gastro-esophageal reflux disease without esophagitis: Secondary | ICD-10-CM | POA: Diagnosis not present

## 2022-04-27 DIAGNOSIS — R7303 Prediabetes: Secondary | ICD-10-CM | POA: Diagnosis not present

## 2022-04-27 DIAGNOSIS — Z6841 Body Mass Index (BMI) 40.0 and over, adult: Secondary | ICD-10-CM | POA: Diagnosis not present

## 2022-04-27 DIAGNOSIS — N1831 Chronic kidney disease, stage 3a: Secondary | ICD-10-CM | POA: Diagnosis not present

## 2022-04-27 DIAGNOSIS — K743 Primary biliary cirrhosis: Secondary | ICD-10-CM | POA: Diagnosis not present

## 2022-05-10 DIAGNOSIS — K7460 Unspecified cirrhosis of liver: Secondary | ICD-10-CM | POA: Diagnosis not present

## 2022-05-10 DIAGNOSIS — R319 Hematuria, unspecified: Secondary | ICD-10-CM | POA: Diagnosis not present

## 2022-05-10 DIAGNOSIS — R809 Proteinuria, unspecified: Secondary | ICD-10-CM | POA: Diagnosis not present

## 2022-05-10 DIAGNOSIS — I129 Hypertensive chronic kidney disease with stage 1 through stage 4 chronic kidney disease, or unspecified chronic kidney disease: Secondary | ICD-10-CM | POA: Diagnosis not present

## 2022-05-10 DIAGNOSIS — N1831 Chronic kidney disease, stage 3a: Secondary | ICD-10-CM | POA: Diagnosis not present

## 2022-05-11 DIAGNOSIS — E7800 Pure hypercholesterolemia, unspecified: Secondary | ICD-10-CM | POA: Diagnosis not present

## 2022-05-11 DIAGNOSIS — F3341 Major depressive disorder, recurrent, in partial remission: Secondary | ICD-10-CM | POA: Diagnosis not present

## 2022-05-11 DIAGNOSIS — Z6841 Body Mass Index (BMI) 40.0 and over, adult: Secondary | ICD-10-CM | POA: Diagnosis not present

## 2022-05-11 DIAGNOSIS — K743 Primary biliary cirrhosis: Secondary | ICD-10-CM | POA: Diagnosis not present

## 2022-05-11 DIAGNOSIS — N1831 Chronic kidney disease, stage 3a: Secondary | ICD-10-CM | POA: Diagnosis not present

## 2022-05-11 DIAGNOSIS — R7303 Prediabetes: Secondary | ICD-10-CM | POA: Diagnosis not present

## 2022-05-11 DIAGNOSIS — I10 Essential (primary) hypertension: Secondary | ICD-10-CM | POA: Diagnosis not present

## 2022-05-16 DIAGNOSIS — F3341 Major depressive disorder, recurrent, in partial remission: Secondary | ICD-10-CM | POA: Diagnosis not present

## 2022-05-16 DIAGNOSIS — Z6841 Body Mass Index (BMI) 40.0 and over, adult: Secondary | ICD-10-CM | POA: Diagnosis not present

## 2022-05-16 DIAGNOSIS — K743 Primary biliary cirrhosis: Secondary | ICD-10-CM | POA: Diagnosis not present

## 2022-05-16 DIAGNOSIS — N1831 Chronic kidney disease, stage 3a: Secondary | ICD-10-CM | POA: Diagnosis not present

## 2022-05-16 DIAGNOSIS — F514 Sleep terrors [night terrors]: Secondary | ICD-10-CM | POA: Diagnosis not present

## 2022-05-16 DIAGNOSIS — D696 Thrombocytopenia, unspecified: Secondary | ICD-10-CM | POA: Diagnosis not present

## 2022-05-25 DIAGNOSIS — N1831 Chronic kidney disease, stage 3a: Secondary | ICD-10-CM | POA: Diagnosis not present

## 2022-05-25 DIAGNOSIS — Z6841 Body Mass Index (BMI) 40.0 and over, adult: Secondary | ICD-10-CM | POA: Diagnosis not present

## 2022-05-25 DIAGNOSIS — I10 Essential (primary) hypertension: Secondary | ICD-10-CM | POA: Diagnosis not present

## 2022-05-25 DIAGNOSIS — F3341 Major depressive disorder, recurrent, in partial remission: Secondary | ICD-10-CM | POA: Diagnosis not present

## 2022-05-25 DIAGNOSIS — E7800 Pure hypercholesterolemia, unspecified: Secondary | ICD-10-CM | POA: Diagnosis not present

## 2022-05-25 DIAGNOSIS — M533 Sacrococcygeal disorders, not elsewhere classified: Secondary | ICD-10-CM | POA: Diagnosis not present

## 2022-06-08 DIAGNOSIS — E7800 Pure hypercholesterolemia, unspecified: Secondary | ICD-10-CM | POA: Diagnosis not present

## 2022-06-08 DIAGNOSIS — I10 Essential (primary) hypertension: Secondary | ICD-10-CM | POA: Diagnosis not present

## 2022-06-08 DIAGNOSIS — F3341 Major depressive disorder, recurrent, in partial remission: Secondary | ICD-10-CM | POA: Diagnosis not present

## 2022-06-08 DIAGNOSIS — Z6841 Body Mass Index (BMI) 40.0 and over, adult: Secondary | ICD-10-CM | POA: Diagnosis not present

## 2022-06-08 DIAGNOSIS — M533 Sacrococcygeal disorders, not elsewhere classified: Secondary | ICD-10-CM | POA: Diagnosis not present

## 2022-06-08 DIAGNOSIS — N1831 Chronic kidney disease, stage 3a: Secondary | ICD-10-CM | POA: Diagnosis not present

## 2022-07-06 DIAGNOSIS — I10 Essential (primary) hypertension: Secondary | ICD-10-CM | POA: Diagnosis not present

## 2022-07-06 DIAGNOSIS — Z6841 Body Mass Index (BMI) 40.0 and over, adult: Secondary | ICD-10-CM | POA: Diagnosis not present

## 2022-07-06 DIAGNOSIS — M533 Sacrococcygeal disorders, not elsewhere classified: Secondary | ICD-10-CM | POA: Diagnosis not present

## 2022-07-06 DIAGNOSIS — E7800 Pure hypercholesterolemia, unspecified: Secondary | ICD-10-CM | POA: Diagnosis not present

## 2022-07-06 DIAGNOSIS — F3341 Major depressive disorder, recurrent, in partial remission: Secondary | ICD-10-CM | POA: Diagnosis not present

## 2022-07-06 DIAGNOSIS — N1831 Chronic kidney disease, stage 3a: Secondary | ICD-10-CM | POA: Diagnosis not present

## 2022-07-19 DIAGNOSIS — N1831 Chronic kidney disease, stage 3a: Secondary | ICD-10-CM | POA: Diagnosis not present

## 2022-07-19 DIAGNOSIS — R319 Hematuria, unspecified: Secondary | ICD-10-CM | POA: Diagnosis not present

## 2022-07-19 DIAGNOSIS — K7460 Unspecified cirrhosis of liver: Secondary | ICD-10-CM | POA: Diagnosis not present

## 2022-07-19 DIAGNOSIS — Q8781 Alport syndrome: Secondary | ICD-10-CM | POA: Diagnosis not present

## 2022-07-19 DIAGNOSIS — I129 Hypertensive chronic kidney disease with stage 1 through stage 4 chronic kidney disease, or unspecified chronic kidney disease: Secondary | ICD-10-CM | POA: Diagnosis not present

## 2022-07-19 DIAGNOSIS — R809 Proteinuria, unspecified: Secondary | ICD-10-CM | POA: Diagnosis not present

## 2022-07-27 DIAGNOSIS — I10 Essential (primary) hypertension: Secondary | ICD-10-CM | POA: Diagnosis not present

## 2022-08-01 DIAGNOSIS — L821 Other seborrheic keratosis: Secondary | ICD-10-CM | POA: Diagnosis not present

## 2022-08-01 DIAGNOSIS — L989 Disorder of the skin and subcutaneous tissue, unspecified: Secondary | ICD-10-CM | POA: Diagnosis not present

## 2022-08-01 DIAGNOSIS — L309 Dermatitis, unspecified: Secondary | ICD-10-CM | POA: Diagnosis not present

## 2022-08-01 DIAGNOSIS — L259 Unspecified contact dermatitis, unspecified cause: Secondary | ICD-10-CM | POA: Diagnosis not present

## 2022-08-15 DIAGNOSIS — F3341 Major depressive disorder, recurrent, in partial remission: Secondary | ICD-10-CM | POA: Diagnosis not present

## 2022-08-15 DIAGNOSIS — Z6841 Body Mass Index (BMI) 40.0 and over, adult: Secondary | ICD-10-CM | POA: Diagnosis not present

## 2022-08-15 DIAGNOSIS — R300 Dysuria: Secondary | ICD-10-CM | POA: Diagnosis not present

## 2022-08-15 DIAGNOSIS — N1831 Chronic kidney disease, stage 3a: Secondary | ICD-10-CM | POA: Diagnosis not present

## 2022-08-15 DIAGNOSIS — I10 Essential (primary) hypertension: Secondary | ICD-10-CM | POA: Diagnosis not present

## 2022-08-23 DIAGNOSIS — M1611 Unilateral primary osteoarthritis, right hip: Secondary | ICD-10-CM | POA: Diagnosis not present

## 2022-08-23 DIAGNOSIS — M810 Age-related osteoporosis without current pathological fracture: Secondary | ICD-10-CM | POA: Diagnosis not present

## 2022-08-30 ENCOUNTER — Other Ambulatory Visit: Payer: Self-pay | Admitting: Gastroenterology

## 2022-08-30 DIAGNOSIS — K7460 Unspecified cirrhosis of liver: Secondary | ICD-10-CM

## 2022-09-05 ENCOUNTER — Ambulatory Visit
Admission: RE | Admit: 2022-09-05 | Discharge: 2022-09-05 | Disposition: A | Discharge status: Home / Self Care | Payer: Medicare HMO | Source: Ambulatory Visit | Attending: Gastroenterology | Admitting: Gastroenterology

## 2022-09-05 DIAGNOSIS — K7460 Unspecified cirrhosis of liver: Secondary | ICD-10-CM | POA: Diagnosis not present

## 2022-09-05 DIAGNOSIS — Z9049 Acquired absence of other specified parts of digestive tract: Secondary | ICD-10-CM | POA: Diagnosis not present

## 2022-09-09 DIAGNOSIS — M25551 Pain in right hip: Secondary | ICD-10-CM | POA: Diagnosis not present

## 2022-09-09 DIAGNOSIS — M1611 Unilateral primary osteoarthritis, right hip: Secondary | ICD-10-CM | POA: Diagnosis not present

## 2022-09-12 DIAGNOSIS — B3731 Acute candidiasis of vulva and vagina: Secondary | ICD-10-CM | POA: Diagnosis not present

## 2022-09-12 DIAGNOSIS — G4489 Other headache syndrome: Secondary | ICD-10-CM | POA: Diagnosis not present

## 2022-09-15 DIAGNOSIS — R809 Proteinuria, unspecified: Secondary | ICD-10-CM | POA: Diagnosis not present

## 2022-09-15 DIAGNOSIS — I129 Hypertensive chronic kidney disease with stage 1 through stage 4 chronic kidney disease, or unspecified chronic kidney disease: Secondary | ICD-10-CM | POA: Diagnosis not present

## 2022-09-15 DIAGNOSIS — K7460 Unspecified cirrhosis of liver: Secondary | ICD-10-CM | POA: Diagnosis not present

## 2022-09-15 DIAGNOSIS — N1831 Chronic kidney disease, stage 3a: Secondary | ICD-10-CM | POA: Diagnosis not present

## 2022-09-15 DIAGNOSIS — Q8781 Alport syndrome: Secondary | ICD-10-CM | POA: Diagnosis not present

## 2022-09-15 DIAGNOSIS — R319 Hematuria, unspecified: Secondary | ICD-10-CM | POA: Diagnosis not present

## 2022-12-12 DIAGNOSIS — M1611 Unilateral primary osteoarthritis, right hip: Secondary | ICD-10-CM | POA: Diagnosis not present

## 2022-12-28 DIAGNOSIS — U071 COVID-19: Secondary | ICD-10-CM | POA: Diagnosis not present

## 2022-12-28 DIAGNOSIS — R051 Acute cough: Secondary | ICD-10-CM | POA: Diagnosis not present

## 2022-12-28 DIAGNOSIS — R509 Fever, unspecified: Secondary | ICD-10-CM | POA: Diagnosis not present

## 2023-01-04 DIAGNOSIS — E559 Vitamin D deficiency, unspecified: Secondary | ICD-10-CM | POA: Diagnosis not present

## 2023-01-04 DIAGNOSIS — M810 Age-related osteoporosis without current pathological fracture: Secondary | ICD-10-CM | POA: Diagnosis not present

## 2023-01-04 DIAGNOSIS — R5383 Other fatigue: Secondary | ICD-10-CM | POA: Diagnosis not present

## 2023-01-10 DIAGNOSIS — N1831 Chronic kidney disease, stage 3a: Secondary | ICD-10-CM | POA: Diagnosis not present

## 2023-01-10 DIAGNOSIS — Q8781 Alport syndrome: Secondary | ICD-10-CM | POA: Diagnosis not present

## 2023-01-10 DIAGNOSIS — R319 Hematuria, unspecified: Secondary | ICD-10-CM | POA: Diagnosis not present

## 2023-01-10 DIAGNOSIS — I129 Hypertensive chronic kidney disease with stage 1 through stage 4 chronic kidney disease, or unspecified chronic kidney disease: Secondary | ICD-10-CM | POA: Diagnosis not present

## 2023-01-10 DIAGNOSIS — K7460 Unspecified cirrhosis of liver: Secondary | ICD-10-CM | POA: Diagnosis not present

## 2023-01-10 DIAGNOSIS — R809 Proteinuria, unspecified: Secondary | ICD-10-CM | POA: Diagnosis not present

## 2023-02-09 DIAGNOSIS — M1611 Unilateral primary osteoarthritis, right hip: Secondary | ICD-10-CM | POA: Diagnosis not present

## 2023-02-15 DIAGNOSIS — M25551 Pain in right hip: Secondary | ICD-10-CM | POA: Diagnosis not present

## 2023-02-15 DIAGNOSIS — M1611 Unilateral primary osteoarthritis, right hip: Secondary | ICD-10-CM | POA: Diagnosis not present

## 2023-02-17 ENCOUNTER — Other Ambulatory Visit: Payer: Self-pay | Admitting: Internal Medicine

## 2023-02-17 DIAGNOSIS — R928 Other abnormal and inconclusive findings on diagnostic imaging of breast: Secondary | ICD-10-CM

## 2023-02-22 ENCOUNTER — Encounter (HOSPITAL_COMMUNITY): Payer: Self-pay

## 2023-02-22 ENCOUNTER — Emergency Department (HOSPITAL_COMMUNITY)
Admission: EM | Admit: 2023-02-22 | Discharge: 2023-02-22 | Disposition: A | Discharge status: Home / Self Care | Payer: Medicare HMO | Attending: Emergency Medicine | Admitting: Emergency Medicine

## 2023-02-22 ENCOUNTER — Emergency Department (HOSPITAL_COMMUNITY): Payer: Medicare HMO

## 2023-02-22 ENCOUNTER — Other Ambulatory Visit: Payer: Self-pay

## 2023-02-22 DIAGNOSIS — N3289 Other specified disorders of bladder: Secondary | ICD-10-CM | POA: Diagnosis not present

## 2023-02-22 DIAGNOSIS — N3000 Acute cystitis without hematuria: Secondary | ICD-10-CM | POA: Diagnosis not present

## 2023-02-22 DIAGNOSIS — N281 Cyst of kidney, acquired: Secondary | ICD-10-CM | POA: Diagnosis not present

## 2023-02-22 DIAGNOSIS — T148XXA Other injury of unspecified body region, initial encounter: Secondary | ICD-10-CM | POA: Diagnosis not present

## 2023-02-22 DIAGNOSIS — X58XXXA Exposure to other specified factors, initial encounter: Secondary | ICD-10-CM | POA: Diagnosis not present

## 2023-02-22 DIAGNOSIS — S39012A Strain of muscle, fascia and tendon of lower back, initial encounter: Secondary | ICD-10-CM | POA: Diagnosis not present

## 2023-02-22 DIAGNOSIS — I10 Essential (primary) hypertension: Secondary | ICD-10-CM | POA: Insufficient documentation

## 2023-02-22 DIAGNOSIS — K7460 Unspecified cirrhosis of liver: Secondary | ICD-10-CM | POA: Diagnosis not present

## 2023-02-22 DIAGNOSIS — Z79899 Other long term (current) drug therapy: Secondary | ICD-10-CM | POA: Diagnosis not present

## 2023-02-22 DIAGNOSIS — R109 Unspecified abdominal pain: Secondary | ICD-10-CM | POA: Diagnosis not present

## 2023-02-22 LAB — CBC WITH DIFFERENTIAL/PLATELET
Abs Immature Granulocytes: 0.02 10*3/uL (ref 0.00–0.07)
Basophils Absolute: 0 10*3/uL (ref 0.0–0.1)
Basophils Relative: 0 %
Eosinophils Absolute: 0.1 10*3/uL (ref 0.0–0.5)
Eosinophils Relative: 3 %
HCT: 33.8 % — ABNORMAL LOW (ref 36.0–46.0)
Hemoglobin: 11 g/dL — ABNORMAL LOW (ref 12.0–15.0)
Immature Granulocytes: 0 %
Lymphocytes Relative: 16 %
Lymphs Abs: 0.9 10*3/uL (ref 0.7–4.0)
MCH: 27.5 pg (ref 26.0–34.0)
MCHC: 32.5 g/dL (ref 30.0–36.0)
MCV: 84.5 fL (ref 80.0–100.0)
Monocytes Absolute: 0.5 10*3/uL (ref 0.1–1.0)
Monocytes Relative: 9 %
Neutro Abs: 4 10*3/uL (ref 1.7–7.7)
Neutrophils Relative %: 72 %
Platelets: 169 10*3/uL (ref 150–400)
RBC: 4 MIL/uL (ref 3.87–5.11)
RDW: 13 % (ref 11.5–15.5)
WBC: 5.5 10*3/uL (ref 4.0–10.5)
nRBC: 0 % (ref 0.0–0.2)

## 2023-02-22 LAB — URINALYSIS, ROUTINE W REFLEX MICROSCOPIC
Bilirubin Urine: NEGATIVE
Glucose, UA: >=500 mg/dL — AB
Ketones, ur: NEGATIVE mg/dL
Nitrite: NEGATIVE
Protein, ur: 100 mg/dL — AB
Specific Gravity, Urine: 1.016 (ref 1.005–1.030)
pH: 5 (ref 5.0–8.0)

## 2023-02-22 LAB — BASIC METABOLIC PANEL
Anion gap: 8 (ref 5–15)
BUN: 14 mg/dL (ref 8–23)
CO2: 21 mmol/L — ABNORMAL LOW (ref 22–32)
Calcium: 9.7 mg/dL (ref 8.9–10.3)
Chloride: 109 mmol/L (ref 98–111)
Creatinine, Ser: 1.02 mg/dL — ABNORMAL HIGH (ref 0.44–1.00)
GFR, Estimated: >60 mL/min (ref >60)
Glucose, Bld: 93 mg/dL (ref 70–99)
Potassium: 4.2 mmol/L (ref 3.5–5.1)
Sodium: 138 mmol/L (ref 135–145)

## 2023-02-22 MED ORDER — HYDROCODONE-ACETAMINOPHEN 5-325 MG PO TABS
1.0000 | ORAL_TABLET | Freq: Once | ORAL | Status: AC
  Administered 2023-02-22: 1 via ORAL
  Filled 2023-02-22: qty 1

## 2023-02-22 MED ORDER — LIDOCAINE 5 % EX PTCH
1.0000 | MEDICATED_PATCH | CUTANEOUS | Status: DC
  Administered 2023-02-22: 1 via TRANSDERMAL
  Filled 2023-02-22: qty 1

## 2023-02-22 MED ORDER — METHOCARBAMOL 500 MG PO TABS: 500.0000 mg | ORAL_TABLET | Freq: Two times a day (BID) | ORAL | 0 refills | Status: AC | PRN

## 2023-02-22 MED ORDER — LIDOCAINE 5 % EX PTCH: 1.0000 | MEDICATED_PATCH | CUTANEOUS | 0 refills | Status: AC

## 2023-02-22 MED ORDER — CEPHALEXIN 500 MG PO CAPS: 500.0000 mg | ORAL_CAPSULE | Freq: Two times a day (BID) | ORAL | 0 refills | Status: AC

## 2023-02-22 NOTE — ED Provider Notes (Signed)
[place] EMERGENCY DEPARTMENT AT [facility] Provider Note   CSN: [id number] Arrival date & time: 02/22/23  8469     History  Chief Complaint  Patient presents with   Back Pain    Natalie Villarreal is a 62 y.o. female history of hypertension, primary biliary cirrhosis, disc bulging at the L5-S1 based on 06/19/2021 MRI lumbar spine presented with left flank pain for the past 5 days.  Patient states that she had an MRI done at 11/10 and then began having her left flank pain on 1115.  Patient has tried over-the-counter anti-inflammatories, hot baths, ice to no relief.  Patient does believe this is muscle skeletal as it hurts to move but denies any abdominal pain nausea vomiting, chest pain, shortness of breath, hematuria, dysuria.  Patient does have history of kidney stones but states this feels different.    Home Medications Prior to Admission medications   Medication Sig Start Date End Date Taking? Authorizing Provider  alendronate (FOSAMAX) 10 MG tablet Take 10 mg by mouth.   Yes [provider]  amLODipine (NORVASC) 10 MG tablet Take 10 mg by mouth daily. 10/30/21  Yes [provider]  buPROPion (WELLBUTRIN XL) 300 MG 24 hr tablet Take 300 mg by mouth daily. 12/07/22  Yes [provider]  citalopram (CELEXA) 10 MG tablet Take 10 mg by mouth daily. 01/25/23  Yes [provider]  clobetasol cream (TEMOVATE) 0.05 % Apply 1 Application topically 2 (two) times daily as needed (Dermatitis). 11/24/21  Yes [provider]  diclofenac Sodium (VOLTAREN) 1 % GEL Apply 4 g topically 4 (four) times daily as needed (Pain).   Yes [provider]  enalapril (VASOTEC) 10 MG tablet Take 10 mg by mouth daily.   Yes [provider]  JARDIANCE 10 MG TABS tablet Take 10 mg by mouth daily. 12/12/22  Yes [provider]  KLOR-CON M20 20 MEQ tablet Take 20 mEq by mouth daily. 01/07/22  Yes [provider]  lidocaine (LIDODERM) 5 %  Place 1 patch onto the skin daily. Remove & Discard patch within 12 hours or as directed by MD 02/22/23  Yes Edwin Cherian, Beverly Gust, PA-C  LORazepam (ATIVAN) 0.5 MG tablet Take 0.5 mg by mouth daily as needed for anxiety. 06/08/16  Yes [provider]  pantoprazole (PROTONIX) 40 MG tablet Take 40 mg by mouth daily as needed (Indigestion). 05/16/19  Yes [provider]  PROLIA 60 MG/ML SOSY injection Inject 60 mg into the skin every 6 (six) months. 01/12/22  Yes [provider]  rosuvastatin (CRESTOR) 10 MG tablet Take 10 mg by mouth at bedtime. 11/29/21  Yes [provider]  traZODone (DESYREL) 50 MG tablet Take 50 mg by mouth at bedtime. 01/07/22  Yes [provider]  ursodiol (ACTIGALL) 300 MG capsule Take 300 mg by mouth 2 (two) times daily. 06/06/16  Yes [provider]  valsartan (DIOVAN) 160 MG tablet Take 160 mg by mouth daily. 12/24/22  Yes [provider]  cephALEXin (KEFLEX) 500 MG capsule Take 1 capsule (500 mg total) by mouth 2 (two) times daily for 5 days. 02/22/23 02/27/23 Yes Lulabelle Desta, Beverly Gust, PA-C  hydrochlorothiazide (HYDRODIURIL) 25 MG tablet Take by mouth. Patient not taking: Reported on 02/22/2023 06/22/16   [provider]  methocarbamol (ROBAXIN) 500 MG tablet Take 1 tablet (500 mg total) by mouth 2 (two) times daily as needed for up to 10 days for muscle spasms. 02/22/23 03/04/23  Netta Corrigan, PA-C  Allergies    Ciprofloxacin, Doxycycline, and Pravastatin    Review of Systems   Review of Systems  Musculoskeletal:  Positive for back pain.    Physical Exam Updated Vital Signs BP 135/78   Pulse (!) 53   Temp 98.1 F (36.7 C)   Resp 16   Ht 4\' 9"  (1.448 m)   Wt 91.2 kg   LMP  (LMP Unknown)   SpO2 98%   BMI 43.50 kg/m  Physical Exam Vitals reviewed.  Constitutional:      General: She is not in acute distress. HENT:     Head: Normocephalic and atraumatic.  Eyes:     Extraocular Movements:  Extraocular movements intact.     Conjunctiva/sclera: Conjunctivae normal.     Pupils: Pupils are equal, round, and reactive to light.  Cardiovascular:     Rate and Rhythm: Normal rate and regular rhythm.     Pulses: Normal pulses.     Heart sounds: Normal heart sounds.     Comments: 2+ bilateral radial/dorsalis pedis pulses with regular rate Pulmonary:     Effort: Pulmonary effort is normal. No respiratory distress.     Breath sounds: Normal breath sounds.  Abdominal:     Palpations: Abdomen is soft.     Tenderness: There is no abdominal tenderness. There is left CVA tenderness. There is no right CVA tenderness, guarding or rebound.  Musculoskeletal:        General: Normal range of motion.     Cervical back: Normal range of motion and neck supple.     Comments: 5 out of 5 bilateral grip/leg extension strength  Skin:    General: Skin is warm and dry.     Capillary Refill: Capillary refill takes less than 2 seconds.  Neurological:     General: No focal deficit present.     Mental Status: She is alert and oriented to person, place, and time.     Comments: Sensation intact in all 4 limbs  Psychiatric:        Mood and Affect: Mood normal.     ED Results / Procedures / Treatments   Labs (all labs ordered are listed, but only abnormal results are displayed) Labs Reviewed  BASIC METABOLIC PANEL - Abnormal; Notable for the following components:      Result Value   CO2 21 (*)    Creatinine, Ser 1.02 (*)    All other components within normal limits  CBC WITH DIFFERENTIAL/PLATELET - Abnormal; Notable for the following components:   Hemoglobin 11.0 (*)    HCT 33.8 (*)    All other components within normal limits  URINALYSIS, ROUTINE W REFLEX MICROSCOPIC - Abnormal; Notable for the following components:   APPearance HAZY (*)    Glucose, UA >=500 (*)    Hgb urine dipstick SMALL (*)    Protein, ur 100 (*)    Leukocytes,Ua TRACE (*)    Bacteria, UA RARE (*)    Non Squamous  Epithelial 0-5 (*)    All other components within normal limits  URINE CULTURE    EKG None  Radiology CT Renal Stone Study  Result Date: 02/22/2023 CLINICAL DATA:  Abdominal/flank pain, stone suspected EXAM: CT ABDOMEN AND PELVIS WITHOUT CONTRAST TECHNIQUE: Multidetector CT imaging of the abdomen and pelvis was performed following the standard protocol without IV contrast. RADIATION DOSE REDUCTION: This exam was performed according to the departmental dose-optimization program which includes automated exposure control, adjustment of the mA and/or kV according to patient size and/or  use of iterative reconstruction technique. COMPARISON:  CT abdomen/pelvis dated October 30, 2015. FINDINGS: Lower chest: No acute abnormality. Hepatobiliary: Cirrhotic morphology of the liver with surface contour nodularity. No suspicious focal lesion, within the limits of an unenhanced exam. Status post cholecystectomy. No biliary ductal dilation. Pancreas: Unremarkable. No pancreatic ductal dilatation or surrounding inflammatory changes. Spleen: Normal in size without focal abnormality. Adrenals/Urinary Tract: Adrenal glands are unremarkable. Bilateral renal cysts, with the largest measuring up to 5.5 cm in the interpolar left kidney, for which no specific follow-up imaging is recommended. No suspicious focal lesion identified, within limits of an unenhanced exam. No renal calculi or hydronephrosis. Bladder is minimally distended with mild circumferential bladder wall thickening. Stomach/Bowel: Stomach is within normal limits. No evidence of bowel wall thickening, distention, or focal inflammatory changes. Vascular/Lymphatic: Aortic atherosclerosis. No enlarged abdominal or pelvic lymph nodes. Reproductive: Status post hysterectomy. No adnexal masses. Other: No abdominopelvic ascites.  No intraperitoneal free air. Musculoskeletal: No acute osseous abnormality. No suspicious osseous lesion. Degenerative changes at T9-T10.  IMPRESSION: 1. No evidence of nephrolithiasis or hydronephrosis. 2. Mild circumferential bladder wall thickening may be due to underdistention or cystitis, recommend correlation with urinalysis. 3. Cirrhosis. 4.  Aortic Atherosclerosis (ICD10-I70.0). Electronically Signed   By: Hart Robinsons M.D.   On: 02/22/2023 09:55    Procedures Procedures    Medications Ordered in ED Medications  lidocaine (LIDODERM) 5 % 1 patch (1 patch Transdermal Patch Applied 02/22/23 0732)  HYDROcodone-acetaminophen (NORCO/VICODIN) 5-325 MG per tablet 1 tablet (1 tablet Oral Given 02/22/23 0732)    ED Course/ Medical Decision Making/ A&P                                 Medical Decision Making  Natalie Villarreal 62 y.o. presented today for flank pain. Working DDx that I considered at this time includes, but not limited to, MSK, nephrolithiasis, pyelonephritis, AAA, aortic dissection, mesenteric ischemia, RCC, obstructive uropathy, renal infarct/hemorrhage, tumor, biliary colic, pancreatitis, appendicitis, SBO, diverticulitis, shingles, lower lobe pneumonia, ectopic pregnancy, PID/TOA, ovarian torsion, endometriosis.  R/o DDx: nephrolithiasis, pyelonephritis, AAA, aortic dissection, mesenteric ischemia, RCC, obstructive uropathy, renal infarct/hemorrhage, tumor, biliary colic, pancreatitis, appendicitis, SBO, diverticulitis, shingles, lower lobe pneumonia, ectopic pregnancy, PID/TOA, ovarian torsion, endometriosis: These are considered less likely due to history of present illness, physical exam, lab/imaging findings  Review of prior external notes: 01/10/2023 office visit  Unique Tests and My Interpretation:  CBC: Unremarkable BMP: Unremarkable UA: Some pyuria noted CT Renal stone study: Possible cystitis  Social Determinants of Health: none  Discussion with Independent Historian: None  Discussion of Management of Tests: None  Risk: Medium: prescription drug management  Risk Stratification Score:  None  Plan: On exam patient was in no acute distress but does appear uncomfortable on exam.  Moving does seem to exacerbate patient's left flank pain which could be MSK however she does have CVA tenderness.  Patient had a urinary symptoms but states she does have history of stones.  Given patient's age and risk factors will obtain basic labs and renal stone study to rule out stone.  If negative we will treat for MSK as per the patient's wishes if the imaging and labs are negative.  Patient's labs are ultimately reassuring.  Patient's UA does have some bacteria however patient is endorsing any urinary symptoms or infectious symptoms and so will not treat at this time but will send urine for culture.  CT scan but  my independent interpretation does show chronic left-sided renal cyst but I do not see any acute abnormalities.  On recheck patient states she felt much better with the pain meds and states that she would like to be discharged pending CT.  Will give patient her Robaxin at her request and spoke to her about how this medication may make her drowsy and do not operate machinery or drive after taking it.  Encourage patient to also use the lidocaine patch so prescribed follow-up with her primary care provider and spine specialist.  CT does show possible cystitis and given her flank pain along with some pyuria we will give her a few days worth of Keflex and send her urine for culture.  Patient be discharged as per the original plan with Robaxin and following up with her primary care provider and spine specialist.  Patient was given return precautions. Patient stable for discharge at this time.  Patient verbalized understanding of plan.  This chart was dictated using voice recognition software.  Despite best efforts to proofread,  errors can occur which can change the documentation meaning.         Final Clinical Impression(s) / ED Diagnoses Final diagnoses:  Muscle strain  Acute cystitis  without hematuria    Rx / DC Orders ED Discharge Orders          Ordered    methocarbamol (ROBAXIN) 500 MG tablet  2 times daily PRN        02/22/23 0919    lidocaine (LIDODERM) 5 %  Every 24 hours        02/22/23 0919    cephALEXin (KEFLEX) 500 MG capsule  2 times daily        02/22/23 1011           None   Netta Corrigan, [place] 02/22/23 1012    Tegeler, Canary Brim, MD 02/22/23 1045

## 2023-02-22 NOTE — Discharge Instructions (Addendum)
Please follow-up with your primary care provider in regards to recent symptoms and ER visit with your spine specialist.  Today your labs and imaging are reassuring this most likely a muscle strain.  You may take the muscle relaxer I have prescribed for you.  This medication will make you drowsy so please do not operate machinery or drive afterwards.  You may use ice as well and the lidocaine patches I prescribed.  Your CT scan also shows that you may have possible cystitis along with some bacteria in your urine and so that was sent off to get cultured however I have given you a few days worth of antibiotics in the meantime.  If symptoms change or worsen please return to ER.

## 2023-02-22 NOTE — ED Triage Notes (Addendum)
Recently had a MRI at [facility] on 11/10 for ongoing back/ hip pain.   Since the MRI w contrast pt says she has been having severe left kidney pain.   Has attempted hot compresses, Voltaren cream and hot showers for pain with no improvement.

## 2023-02-22 NOTE — ED Notes (Signed)
Pt verbalized understanding of discharge instructions. Pt ambulated from ed with steady gait.  

## 2023-02-23 LAB — URINE CULTURE: Culture: NO GROWTH

## 2023-02-24 DIAGNOSIS — K219 Gastro-esophageal reflux disease without esophagitis: Secondary | ICD-10-CM | POA: Diagnosis not present

## 2023-02-24 DIAGNOSIS — Z0000 Encounter for general adult medical examination without abnormal findings: Secondary | ICD-10-CM | POA: Diagnosis not present

## 2023-02-24 DIAGNOSIS — Z6841 Body Mass Index (BMI) 40.0 and over, adult: Secondary | ICD-10-CM | POA: Diagnosis not present

## 2023-02-24 DIAGNOSIS — Z1331 Encounter for screening for depression: Secondary | ICD-10-CM | POA: Diagnosis not present

## 2023-02-24 DIAGNOSIS — I10 Essential (primary) hypertension: Secondary | ICD-10-CM | POA: Diagnosis not present

## 2023-02-24 DIAGNOSIS — M810 Age-related osteoporosis without current pathological fracture: Secondary | ICD-10-CM | POA: Diagnosis not present

## 2023-02-24 DIAGNOSIS — F3341 Major depressive disorder, recurrent, in partial remission: Secondary | ICD-10-CM | POA: Diagnosis not present

## 2023-02-24 DIAGNOSIS — I129 Hypertensive chronic kidney disease with stage 1 through stage 4 chronic kidney disease, or unspecified chronic kidney disease: Secondary | ICD-10-CM | POA: Diagnosis not present

## 2023-02-24 DIAGNOSIS — K743 Primary biliary cirrhosis: Secondary | ICD-10-CM | POA: Diagnosis not present

## 2023-02-24 DIAGNOSIS — M19071 Primary osteoarthritis, right ankle and foot: Secondary | ICD-10-CM | POA: Diagnosis not present

## 2023-03-01 DIAGNOSIS — Z1231 Encounter for screening mammogram for malignant neoplasm of breast: Secondary | ICD-10-CM | POA: Diagnosis not present

## 2023-04-10 DIAGNOSIS — R202 Paresthesia of skin: Secondary | ICD-10-CM | POA: Diagnosis not present

## 2023-04-14 ENCOUNTER — Other Ambulatory Visit: Payer: Self-pay | Admitting: Gastroenterology

## 2023-04-14 DIAGNOSIS — K625 Hemorrhage of anus and rectum: Secondary | ICD-10-CM | POA: Diagnosis not present

## 2023-04-14 DIAGNOSIS — K743 Primary biliary cirrhosis: Secondary | ICD-10-CM | POA: Diagnosis not present

## 2023-04-14 DIAGNOSIS — K7460 Unspecified cirrhosis of liver: Secondary | ICD-10-CM

## 2023-04-14 DIAGNOSIS — K219 Gastro-esophageal reflux disease without esophagitis: Secondary | ICD-10-CM | POA: Diagnosis not present

## 2023-04-14 DIAGNOSIS — D649 Anemia, unspecified: Secondary | ICD-10-CM | POA: Diagnosis not present

## 2023-04-17 DIAGNOSIS — H2513 Age-related nuclear cataract, bilateral: Secondary | ICD-10-CM | POA: Diagnosis not present

## 2023-04-17 DIAGNOSIS — H35033 Hypertensive retinopathy, bilateral: Secondary | ICD-10-CM | POA: Diagnosis not present

## 2023-04-17 DIAGNOSIS — H524 Presbyopia: Secondary | ICD-10-CM | POA: Diagnosis not present

## 2023-04-17 DIAGNOSIS — H5203 Hypermetropia, bilateral: Secondary | ICD-10-CM | POA: Diagnosis not present

## 2023-04-17 DIAGNOSIS — H52223 Regular astigmatism, bilateral: Secondary | ICD-10-CM | POA: Diagnosis not present

## 2023-04-18 ENCOUNTER — Encounter: Payer: Self-pay | Admitting: Neurology

## 2023-04-18 ENCOUNTER — Other Ambulatory Visit: Payer: Self-pay

## 2023-04-18 DIAGNOSIS — R202 Paresthesia of skin: Secondary | ICD-10-CM

## 2023-04-20 ENCOUNTER — Ambulatory Visit
Admission: RE | Admit: 2023-04-20 | Discharge: 2023-04-20 | Disposition: A | Discharge status: Home / Self Care | Payer: Medicare HMO | Source: Ambulatory Visit | Attending: Gastroenterology | Admitting: Gastroenterology

## 2023-04-20 DIAGNOSIS — K7460 Unspecified cirrhosis of liver: Secondary | ICD-10-CM | POA: Diagnosis not present

## 2023-04-26 DIAGNOSIS — R5383 Other fatigue: Secondary | ICD-10-CM | POA: Diagnosis not present

## 2023-04-26 DIAGNOSIS — M810 Age-related osteoporosis without current pathological fracture: Secondary | ICD-10-CM | POA: Diagnosis not present

## 2023-04-27 ENCOUNTER — Telehealth: Payer: Self-pay | Admitting: Pharmacy Technician

## 2023-04-27 ENCOUNTER — Other Ambulatory Visit: Payer: Self-pay | Admitting: Sports Medicine

## 2023-04-27 DIAGNOSIS — M810 Age-related osteoporosis without current pathological fracture: Secondary | ICD-10-CM | POA: Insufficient documentation

## 2023-04-27 NOTE — Telephone Encounter (Signed)
Auth Submission: NO AUTH NEEDED Site of care: Site of care: [facility] Payer: [employer] MEDICARE Medication & CPT/J Code(s) submitted: Reclast (Zolendronic acid) [id number] Route of submission (phone, fax, portal):  Phone # Fax # Auth type: Buy/Bill PB Units/visits requested: X1 Reference number:  Approval from: 04/27/23 to 09/25/23

## 2023-05-01 ENCOUNTER — Ambulatory Visit: Payer: Medicare HMO

## 2023-05-01 VITALS — BP 111/64 | HR 62 | Temp 98.0°F | Resp 16 | Ht <= 58 in | Wt 200.2 lb

## 2023-05-01 DIAGNOSIS — M810 Age-related osteoporosis without current pathological fracture: Secondary | ICD-10-CM

## 2023-05-01 MED ORDER — DIPHENHYDRAMINE HCL 25 MG PO CAPS
25.0000 mg | ORAL_CAPSULE | Freq: Once | ORAL | Status: AC
  Administered 2023-05-01: 25 mg via ORAL
  Filled 2023-05-01: qty 1

## 2023-05-01 MED ORDER — ZOLEDRONIC ACID 5 MG/100ML IV SOLN
5.0000 mg | Freq: Once | INTRAVENOUS | Status: AC
Start: 2023-05-01 — End: 2023-05-01
  Administered 2023-05-01: 5 mg via INTRAVENOUS
  Filled 2023-05-01: qty 100

## 2023-05-01 MED ORDER — ACETAMINOPHEN 325 MG PO TABS
650.0000 mg | ORAL_TABLET | Freq: Once | ORAL | Status: AC
  Administered 2023-05-01: 650 mg via ORAL
  Filled 2023-05-01: qty 2

## 2023-05-01 NOTE — Progress Notes (Signed)
Diagnosis: Osteoporosis  Provider:  Chilton Greathouse MD  Procedure: IV Infusion  IV Type: Peripheral, IV Location: L Antecubital  Reclast (Zolendronic Acid), Dose: 5 mg  Infusion Start Time: 1138  Infusion Stop Time: 1211  Post Infusion IV Care: Observation period completed and Peripheral IV Discontinued  Discharge: Condition: Good, Destination: Home . AVS Provided  Performed by:  Loney Hering, LPN

## 2023-05-11 ENCOUNTER — Ambulatory Visit: Payer: Medicare HMO | Admitting: Neurology

## 2023-05-11 DIAGNOSIS — R202 Paresthesia of skin: Secondary | ICD-10-CM | POA: Diagnosis not present

## 2023-05-11 NOTE — Procedures (Signed)
  [facility]  [address], [address]  [place], [place] [address] Tel: [phone] Fax: [phone] Test Date:  05/11/2023  Patient: Natalie Villarreal DOB: 1961/02/24 Physician: Tonita Blanch, DO  Sex: Female Height: 4' 9 Ref Phys: Toribio Higashi, MD  ID#: [id number]   Technician:    History: This is a 63 year old female referred for evaluation of right thigh pain.  NCV & EMG Findings: Electrodiagnostic testing was prematurely terminated at patient's request due to pain.  Findings are limited and shows: Right superficial peroneal sensory response is within normal limits. Right peroneal and tibial motor responses are within normal limits.   Impression: This is an incomplete study, as testing was terminated due to pain.   ___________________________ Tonita Blanch, DO    Nerve Conduction Studies   Stim Site NR Peak (ms) Norm Peak (ms) O-P Amp (V) Norm O-P Amp  Right Sup Peroneal Anti Sensory (Ant Lat Mall)  32 C  12 cm    2.0 <4.6 21.1 >3     Stim Site NR Onset (ms) Norm Onset (ms) O-P Amp (mV) Norm O-P Amp Site1 Site2 Delta-0 (ms) Dist (cm) Vel (m/s) Norm Vel (m/s)  Right Peroneal Motor (Ext Dig Brev)  32 C  Ankle    2.9 <6.0 3.4 >2.5 B Fib Ankle 6.6 34.0 52 >40  B Fib    9.5  3.2  Poplt B Fib 1.4 8.0 57 >40  Poplt    10.9  2.9         Right Tibial Motor (Abd Hall Brev)  32 C  Ankle    3.1 <6.0 7.5 >4 Knee Ankle 8.2 40.0 49 >40  Knee    11.3  5.4             Waveforms:

## 2023-06-08 DIAGNOSIS — H524 Presbyopia: Secondary | ICD-10-CM | POA: Diagnosis not present

## 2023-06-29 DIAGNOSIS — R739 Hyperglycemia, unspecified: Secondary | ICD-10-CM | POA: Diagnosis not present

## 2023-06-29 DIAGNOSIS — N1831 Chronic kidney disease, stage 3a: Secondary | ICD-10-CM | POA: Diagnosis not present

## 2023-07-12 DIAGNOSIS — R809 Proteinuria, unspecified: Secondary | ICD-10-CM | POA: Diagnosis not present

## 2023-07-12 DIAGNOSIS — Q8781 Alport syndrome: Secondary | ICD-10-CM | POA: Diagnosis not present

## 2023-07-12 DIAGNOSIS — R319 Hematuria, unspecified: Secondary | ICD-10-CM | POA: Diagnosis not present

## 2023-07-12 DIAGNOSIS — I129 Hypertensive chronic kidney disease with stage 1 through stage 4 chronic kidney disease, or unspecified chronic kidney disease: Secondary | ICD-10-CM | POA: Diagnosis not present

## 2023-07-12 DIAGNOSIS — K7460 Unspecified cirrhosis of liver: Secondary | ICD-10-CM | POA: Diagnosis not present

## 2023-07-12 DIAGNOSIS — N1831 Chronic kidney disease, stage 3a: Secondary | ICD-10-CM | POA: Diagnosis not present

## 2023-07-31 DIAGNOSIS — R8290 Unspecified abnormal findings in urine: Secondary | ICD-10-CM | POA: Diagnosis not present

## 2023-07-31 DIAGNOSIS — M62838 Other muscle spasm: Secondary | ICD-10-CM | POA: Diagnosis not present

## 2023-07-31 DIAGNOSIS — B3731 Acute candidiasis of vulva and vagina: Secondary | ICD-10-CM | POA: Diagnosis not present

## 2023-07-31 DIAGNOSIS — M62830 Muscle spasm of back: Secondary | ICD-10-CM | POA: Diagnosis not present

## 2023-07-31 DIAGNOSIS — N898 Other specified noninflammatory disorders of vagina: Secondary | ICD-10-CM | POA: Diagnosis not present

## 2023-08-17 DIAGNOSIS — M545 Low back pain, unspecified: Secondary | ICD-10-CM | POA: Diagnosis not present

## 2023-08-17 DIAGNOSIS — M5441 Lumbago with sciatica, right side: Secondary | ICD-10-CM | POA: Diagnosis not present

## 2023-09-05 DIAGNOSIS — M47816 Spondylosis without myelopathy or radiculopathy, lumbar region: Secondary | ICD-10-CM | POA: Diagnosis not present

## 2023-09-13 DIAGNOSIS — M47816 Spondylosis without myelopathy or radiculopathy, lumbar region: Secondary | ICD-10-CM | POA: Diagnosis not present

## 2023-10-09 DIAGNOSIS — M47816 Spondylosis without myelopathy or radiculopathy, lumbar region: Secondary | ICD-10-CM | POA: Diagnosis not present

## 2023-10-10 ENCOUNTER — Other Ambulatory Visit: Payer: Self-pay | Admitting: Gastroenterology

## 2023-10-10 ENCOUNTER — Encounter: Payer: Self-pay | Admitting: Gastroenterology

## 2023-10-10 DIAGNOSIS — K7460 Unspecified cirrhosis of liver: Secondary | ICD-10-CM

## 2023-10-11 ENCOUNTER — Ambulatory Visit
Admission: RE | Admit: 2023-10-11 | Discharge: 2023-10-11 | Disposition: A | Discharge status: Home / Self Care | Source: Ambulatory Visit | Attending: Gastroenterology | Admitting: Gastroenterology

## 2023-10-11 DIAGNOSIS — K7460 Unspecified cirrhosis of liver: Secondary | ICD-10-CM

## 2023-10-12 DIAGNOSIS — D649 Anemia, unspecified: Secondary | ICD-10-CM | POA: Diagnosis not present

## 2023-10-12 DIAGNOSIS — R899 Unspecified abnormal finding in specimens from other organs, systems and tissues: Secondary | ICD-10-CM | POA: Diagnosis not present

## 2023-10-12 DIAGNOSIS — K219 Gastro-esophageal reflux disease without esophagitis: Secondary | ICD-10-CM | POA: Diagnosis not present

## 2023-10-12 DIAGNOSIS — K743 Primary biliary cirrhosis: Secondary | ICD-10-CM | POA: Diagnosis not present

## 2023-10-17 DIAGNOSIS — K219 Gastro-esophageal reflux disease without esophagitis: Secondary | ICD-10-CM | POA: Diagnosis not present

## 2023-10-17 DIAGNOSIS — M47816 Spondylosis without myelopathy or radiculopathy, lumbar region: Secondary | ICD-10-CM | POA: Diagnosis not present

## 2023-10-17 DIAGNOSIS — I10 Essential (primary) hypertension: Secondary | ICD-10-CM | POA: Diagnosis not present

## 2023-10-17 DIAGNOSIS — N289 Disorder of kidney and ureter, unspecified: Secondary | ICD-10-CM | POA: Diagnosis not present

## 2023-10-17 DIAGNOSIS — F3341 Major depressive disorder, recurrent, in partial remission: Secondary | ICD-10-CM | POA: Diagnosis not present

## 2023-10-17 DIAGNOSIS — M62830 Muscle spasm of back: Secondary | ICD-10-CM | POA: Diagnosis not present

## 2023-10-17 DIAGNOSIS — K743 Primary biliary cirrhosis: Secondary | ICD-10-CM | POA: Diagnosis not present

## 2023-10-31 DIAGNOSIS — M47816 Spondylosis without myelopathy or radiculopathy, lumbar region: Secondary | ICD-10-CM | POA: Diagnosis not present

## 2023-11-07 DIAGNOSIS — K449 Diaphragmatic hernia without obstruction or gangrene: Secondary | ICD-10-CM | POA: Diagnosis not present

## 2023-11-07 DIAGNOSIS — I8500 Esophageal varices without bleeding: Secondary | ICD-10-CM | POA: Diagnosis not present

## 2023-11-07 DIAGNOSIS — D509 Iron deficiency anemia, unspecified: Secondary | ICD-10-CM | POA: Diagnosis not present

## 2023-11-07 DIAGNOSIS — K2970 Gastritis, unspecified, without bleeding: Secondary | ICD-10-CM | POA: Diagnosis not present

## 2023-11-14 DIAGNOSIS — M47816 Spondylosis without myelopathy or radiculopathy, lumbar region: Secondary | ICD-10-CM | POA: Diagnosis not present

## 2023-11-16 DIAGNOSIS — M545 Low back pain, unspecified: Secondary | ICD-10-CM | POA: Diagnosis not present

## 2023-11-16 DIAGNOSIS — M47816 Spondylosis without myelopathy or radiculopathy, lumbar region: Secondary | ICD-10-CM | POA: Diagnosis not present

## 2023-11-16 DIAGNOSIS — M47817 Spondylosis without myelopathy or radiculopathy, lumbosacral region: Secondary | ICD-10-CM | POA: Diagnosis not present

## 2023-12-05 ENCOUNTER — Ambulatory Visit: Payer: Self-pay | Admitting: General Surgery

## 2023-12-05 DIAGNOSIS — K641 Second degree hemorrhoids: Secondary | ICD-10-CM | POA: Diagnosis not present

## 2023-12-05 NOTE — H&P (Signed)
 REFERRING PHYSICIAN:  Elicia Claw, MD   PROVIDER:  BERNARDA WANDA NED, MD   MRN: [id number] DOB: 05/09/1960 DATE OF ENCOUNTER: 12/05/2023   Subjective    Chief Complaint: New Consultation       History of Present Illness: Natalie Villarreal is a 63 y.o. female who is seen today as an office consultation at the request of Dr. Elicia for evaluation of New Consultation .  63 year old female with difficulty with hemorrhoids for the past few years.  She would have occasional flares but then it would get better.  She has now progressed to daily bleeding and some iron deficiency anemia.  She states that this change has been going on for a couple months.  She has not noticed a change in her bowel habits.  She is having regular bowel movements without straining and uses Metamucil on a daily basis.  She has never had any hemorrhoid procedures before.     Review of Systems: A complete review of systems was obtained from the patient.  I have reviewed this information and discussed as appropriate with the patient.  See HPI as well for other ROS.       Medical History: Past Medical History      Past Medical History:  Diagnosis Date   Anemia     Anxiety     Chronic kidney disease     Depression     Hyperlipidemia     Osteoporosis     Primary biliary cholangitis (CMS/HHS-HCC)     Systemic hypertension          Problem List     Patient Active Problem List  Diagnosis   Osteoporosis   Hyperlipidemia   Systemic hypertension   Primary biliary cholangitis (CMS/HHS-HCC)   Depression   Anxiety        Past Surgical History       Past Surgical History:  Procedure Laterality Date   CESAREAN SECTION       CHOLECYSTECTOMY LAPAROSCOPIC W/COMMON BILE DUCT EXPLORATION       HYSTERECTOMY            Allergies       Allergies  Allergen Reactions   Ciprofloxacin Hives   Doxycycline Nausea   Keflex  [Cephalexin ] Rash   Pravastatin Other (See Comments)      MUSCLE PAIN         Medications Ordered Prior to Encounter        Current Outpatient Medications on File Prior to Visit  Medication Sig Dispense Refill   alendronate (FOSAMAX) 70 MG tablet TAKE 1 TABLET BY MOUTH ONCE A WEEK IN THE MORNING ON AN EMPTY STOMACH. DO NOT LIE DOWN FOR 30 MINUTE       amLODIPine (NORVASC) 5 MG tablet 5 mg once daily.       buPROPion (WELLBUTRIN XL) 300 MG XL tablet         citalopram (CELEXA) 40 MG tablet 40 mg once daily.       clobetasol (TEMOVATE) 0.05 % cream as needed.   0   enalapril (VASOTEC) 10 MG tablet Take 10 mg by mouth once daily.       hydroCHLOROthiazide (HYDRODIURIL) 25 MG tablet Take 25 mg by mouth every morning.   2   JARDIANCE 10 mg tablet         LORazepam (ATIVAN) 0.5 MG tablet Take 0.5 mg by mouth every 12 (twelve) hours as needed.   0   methocarbamoL  (ROBAXIN ) 500 MG tablet  rosuvastatin (CRESTOR) 5 MG tablet Take 5 mg by mouth once daily.   3   traZODone (DESYREL) 150 MG tablet 300 mg nightly.       ursodiol (ACTIGALL) 300 mg capsule TAKE AS DIRECTED (2 CAPSULES IN THE MORNING & 3 CAPSULES IN THE EVENING)   1   valsartan (DIOVAN) 160 MG tablet         cholestyramine (QUESTRAN) 4 gram oral powder 2 (two) times daily.   1    No current facility-administered medications on file prior to visit.        Family History       Family History  Problem Relation Age of Onset   High blood pressure (Hypertension) Mother     Hyperlipidemia (Elevated cholesterol) Mother     Renal Insufficiency Mother     High blood pressure (Hypertension) Father     Hyperlipidemia (Elevated cholesterol) Father     Obesity Sister     High blood pressure (Hypertension) Sister     Hyperlipidemia (Elevated cholesterol) Sister     Diabetes Sister     Breast cancer Sister     Sarcoidosis Sister     High blood pressure (Hypertension) Brother     Hyperlipidemia (Elevated cholesterol) Brother     Renal Insufficiency Brother          Tobacco Use History  Social History         Tobacco Use  Smoking Status Former   Current packs/day: 0.00   Types: Cigarettes   Quit date: 07/20/2006   Years since quitting: 17.3  Smokeless Tobacco Never  Tobacco Comments    one pack a week        Social History  Social History         Socioeconomic History   Marital status: Unknown  Tobacco Use   Smoking status: Former      Current packs/day: 0.00      Types: Cigarettes      Quit date: 07/20/2006      Years since quitting: 17.3   Smokeless tobacco: Never   Tobacco comments:      one pack a week  Vaping Use   Vaping status: Never Used  Substance and Sexual Activity   Alcohol use: No   Drug use: No    Social Drivers of Health        Housing Stability: Unknown (12/05/2023)    Housing Stability Vital Sign     Homeless in the Last Year: No        Objective:         Vitals:    12/05/23 1029  BP: 136/80  Pulse: 78  Temp: 36.6 C (97.8 F)  SpO2: 97%  Weight: 93.5 kg (206 lb 3.2 oz)  Height: 144.8 cm (4' 9)  PainSc: 0-No pain      Exam Gen: NAD Abd: soft Rectal: 2 anterior skin tags, no sphincter hypertension or fissure     Labs, Imaging and Diagnostic Testing:   Procedure: Anoscopy Surgeon: Debby After the risks and benefits were explained, written consent was obtained for above procedure.  A medical assistant chaperone was present thoroughout the entire procedure. Examination chaperoned by Burnard Crete. Anesthesia: none Diagnosis: rectal bleeding Findings: Grade 1 left lateral hemorrhoid, grade 2 right anterior and right posterior hemorrhoids.     Assessment and Plan:  Grade II hemorrhoids  (primary encounter diagnosis)   63 year old female with rectal bleeding and anemia.  We discussed surgical options which include rubber band  ligation, hemorrhoidectomy and trans hemorrhoidal dearterialization.  We discussed the risk and benefits of all 3 of these options.  She decided to proceed with trans hemorrhoidal dearterialization.  We  discussed that she will have significant pain for the first few weeks after surgery but this will improve.  We discussed avoiding constipation and diarrhea is much as possible after surgery.  We discussed bleeding risk of surgery.  All questions were answered.  Patient would like to proceed.       Bernarda JAYSON Ned, MD Colon and Rectal Surgery [facility]

## 2023-12-12 DIAGNOSIS — R899 Unspecified abnormal finding in specimens from other organs, systems and tissues: Secondary | ICD-10-CM | POA: Diagnosis not present

## 2024-01-02 DIAGNOSIS — N1831 Chronic kidney disease, stage 3a: Secondary | ICD-10-CM | POA: Diagnosis not present

## 2024-01-04 ENCOUNTER — Other Ambulatory Visit (HOSPITAL_COMMUNITY)

## 2024-01-11 DIAGNOSIS — N1831 Chronic kidney disease, stage 3a: Secondary | ICD-10-CM | POA: Diagnosis not present

## 2024-01-11 DIAGNOSIS — I129 Hypertensive chronic kidney disease with stage 1 through stage 4 chronic kidney disease, or unspecified chronic kidney disease: Secondary | ICD-10-CM | POA: Diagnosis not present

## 2024-01-11 DIAGNOSIS — E669 Obesity, unspecified: Secondary | ICD-10-CM | POA: Diagnosis not present

## 2024-01-11 DIAGNOSIS — R809 Proteinuria, unspecified: Secondary | ICD-10-CM | POA: Diagnosis not present

## 2024-01-11 DIAGNOSIS — Q8781 Alport syndrome: Secondary | ICD-10-CM | POA: Diagnosis not present

## 2024-01-18 ENCOUNTER — Ambulatory Visit (HOSPITAL_COMMUNITY): Admit: 2024-01-18 | Admitting: General Surgery

## 2024-01-18 DIAGNOSIS — L292 Pruritus vulvae: Secondary | ICD-10-CM | POA: Diagnosis not present

## 2024-01-18 SURGERY — TRANSANAL HEMORRHOIDAL DEARTERIALIZATION
Anesthesia: Monitor Anesthesia Care

## 2024-02-15 DIAGNOSIS — L816 Other disorders of diminished melanin formation: Secondary | ICD-10-CM | POA: Diagnosis not present

## 2024-02-15 DIAGNOSIS — L309 Dermatitis, unspecified: Secondary | ICD-10-CM | POA: Diagnosis not present

## 2024-02-15 DIAGNOSIS — B372 Candidiasis of skin and nail: Secondary | ICD-10-CM | POA: Diagnosis not present

## 2024-02-27 DIAGNOSIS — Z7189 Other specified counseling: Secondary | ICD-10-CM | POA: Diagnosis not present

## 2024-02-27 DIAGNOSIS — Z1331 Encounter for screening for depression: Secondary | ICD-10-CM | POA: Diagnosis not present

## 2024-02-27 DIAGNOSIS — Z0000 Encounter for general adult medical examination without abnormal findings: Secondary | ICD-10-CM | POA: Diagnosis not present

## 2024-02-27 DIAGNOSIS — M810 Age-related osteoporosis without current pathological fracture: Secondary | ICD-10-CM | POA: Diagnosis not present

## 2024-02-27 DIAGNOSIS — N1831 Chronic kidney disease, stage 3a: Secondary | ICD-10-CM | POA: Diagnosis not present

## 2024-02-27 DIAGNOSIS — E7800 Pure hypercholesterolemia, unspecified: Secondary | ICD-10-CM | POA: Diagnosis not present

## 2024-02-27 DIAGNOSIS — F3341 Major depressive disorder, recurrent, in partial remission: Secondary | ICD-10-CM | POA: Diagnosis not present

## 2024-02-27 DIAGNOSIS — D638 Anemia in other chronic diseases classified elsewhere: Secondary | ICD-10-CM | POA: Diagnosis not present

## 2024-02-27 DIAGNOSIS — I10 Essential (primary) hypertension: Secondary | ICD-10-CM | POA: Diagnosis not present

## 2024-02-27 DIAGNOSIS — K743 Primary biliary cirrhosis: Secondary | ICD-10-CM | POA: Diagnosis not present

## 2024-03-06 DIAGNOSIS — M810 Age-related osteoporosis without current pathological fracture: Secondary | ICD-10-CM | POA: Diagnosis not present

## 2024-03-06 DIAGNOSIS — Z1231 Encounter for screening mammogram for malignant neoplasm of breast: Secondary | ICD-10-CM | POA: Diagnosis not present

## 2024-03-07 DIAGNOSIS — H903 Sensorineural hearing loss, bilateral: Secondary | ICD-10-CM | POA: Diagnosis not present

## 2024-03-07 DIAGNOSIS — H9113 Presbycusis, bilateral: Secondary | ICD-10-CM | POA: Diagnosis not present

## 2024-03-12 DIAGNOSIS — R899 Unspecified abnormal finding in specimens from other organs, systems and tissues: Secondary | ICD-10-CM | POA: Diagnosis not present

## 2024-03-14 DIAGNOSIS — R7303 Prediabetes: Secondary | ICD-10-CM | POA: Diagnosis not present

## 2024-03-14 DIAGNOSIS — M810 Age-related osteoporosis without current pathological fracture: Secondary | ICD-10-CM | POA: Diagnosis not present

## 2024-03-20 ENCOUNTER — Other Ambulatory Visit (HOSPITAL_COMMUNITY): Payer: Self-pay | Admitting: Sports Medicine

## 2024-04-08 ENCOUNTER — Telehealth: Payer: Self-pay

## 2024-04-08 NOTE — Telephone Encounter (Signed)
 Auth Submission: NO AUTH NEEDED Site of care: Site of care: [facility] Payer: [employer] medicare Medication & CPT/J Code(s) submitted: Reclast  (Zolendronic acid) [id number] Diagnosis Code:  Route of submission (phone, fax, portal): portal Phone # Fax # Auth type: Buy/Bill PB Units/visits requested: 5mg  x 1 dose Reference number:  Approval from: 04/08/24 to 08/01/24

## 2024-04-16 ENCOUNTER — Other Ambulatory Visit: Payer: Self-pay | Admitting: Gastroenterology

## 2024-04-16 DIAGNOSIS — K7469 Other cirrhosis of liver: Secondary | ICD-10-CM

## 2024-04-30 ENCOUNTER — Ambulatory Visit
Admission: RE | Admit: 2024-04-30 | Discharge: 2024-04-30 | Disposition: A | Source: Ambulatory Visit | Attending: Gastroenterology

## 2024-04-30 DIAGNOSIS — K7469 Other cirrhosis of liver: Secondary | ICD-10-CM

## 2024-05-01 ENCOUNTER — Ambulatory Visit

## 2024-05-01 VITALS — BP 113/72 | HR 61 | Temp 97.8°F | Resp 20 | Ht <= 58 in | Wt 216.2 lb

## 2024-05-01 DIAGNOSIS — M810 Age-related osteoporosis without current pathological fracture: Secondary | ICD-10-CM

## 2024-05-01 MED ORDER — ZOLEDRONIC ACID 5 MG/100ML IV SOLN
5.0000 mg | Freq: Once | INTRAVENOUS | Status: AC
  Administered 2024-05-01: 5 mg via INTRAVENOUS
  Filled 2024-05-01: qty 100

## 2024-05-01 MED ORDER — DIPHENHYDRAMINE HCL 25 MG PO CAPS
25.0000 mg | ORAL_CAPSULE | Freq: Once | ORAL | Status: AC
  Administered 2024-05-01: 25 mg via ORAL
  Filled 2024-05-01: qty 1

## 2024-05-01 MED ORDER — ACETAMINOPHEN 325 MG PO TABS
650.0000 mg | ORAL_TABLET | Freq: Once | ORAL | Status: AC
  Administered 2024-05-01: 650 mg via ORAL
  Filled 2024-05-01: qty 2

## 2024-05-01 MED ORDER — SODIUM CHLORIDE 0.9 % IV SOLN: INTRAVENOUS | Status: DC

## 2024-05-01 NOTE — Progress Notes (Signed)
 Diagnosis: Osteoporosis  Provider:  Lonna Coder MD  Procedure: IV Infusion  IV Type: Peripheral, IV Location: L Antecubital  Reclast  (Zolendronic Acid), Dose: 5 mg  Infusion Start Time: 1119  Infusion Stop Time: 1149  Post Infusion IV Care: Observation period completed and Peripheral IV Discontinued  Discharge: Condition: Good, Destination: Home . AVS Declined  Performed by:  Kaidyn Javid, RN

## 2024-05-01 NOTE — Patient Instructions (Signed)
